# Patient Record
Sex: Female | Born: 1940 | Race: White | Hispanic: No | Smoking: Former smoker
Health system: Southern US, Community
[De-identification: ages and names within clinical notes are randomized; demographics above are authoritative.]

## PROBLEM LIST (undated history)

## (undated) DIAGNOSIS — M1990 Unspecified osteoarthritis, unspecified site: Secondary | ICD-10-CM

## (undated) DIAGNOSIS — C801 Malignant (primary) neoplasm, unspecified: Secondary | ICD-10-CM

## (undated) DIAGNOSIS — K219 Gastro-esophageal reflux disease without esophagitis: Secondary | ICD-10-CM

## (undated) DIAGNOSIS — K449 Diaphragmatic hernia without obstruction or gangrene: Secondary | ICD-10-CM

## (undated) DIAGNOSIS — E7800 Pure hypercholesterolemia, unspecified: Secondary | ICD-10-CM

## (undated) DIAGNOSIS — D334 Benign neoplasm of spinal cord: Secondary | ICD-10-CM

## (undated) DIAGNOSIS — Z803 Family history of malignant neoplasm of breast: Secondary | ICD-10-CM

## (undated) DIAGNOSIS — N951 Menopausal and female climacteric states: Secondary | ICD-10-CM

## (undated) DIAGNOSIS — G4730 Sleep apnea, unspecified: Secondary | ICD-10-CM

## (undated) DIAGNOSIS — T7840XA Allergy, unspecified, initial encounter: Secondary | ICD-10-CM

## (undated) HISTORY — DX: Sleep apnea, unspecified: G47.30

## (undated) HISTORY — DX: Malignant (primary) neoplasm, unspecified: C80.1

## (undated) HISTORY — DX: Menopausal and female climacteric states: N95.1

## (undated) HISTORY — DX: Pure hypercholesterolemia, unspecified: E78.00

## (undated) HISTORY — DX: Benign neoplasm of spinal cord: D33.4

## (undated) HISTORY — DX: Family history of malignant neoplasm of breast: Z80.3

## (undated) HISTORY — DX: Unspecified osteoarthritis, unspecified site: M19.90

## (undated) HISTORY — DX: Allergy, unspecified, initial encounter: T78.40XA

## (undated) HISTORY — DX: Gastro-esophageal reflux disease without esophagitis: K21.9

## (undated) HISTORY — DX: Diaphragmatic hernia without obstruction or gangrene: K44.9

---

## 1972-02-12 HISTORY — PX: CHOLECYSTECTOMY: SHX55

## 1972-02-12 HISTORY — PX: APPENDECTOMY: SHX54

## 1984-07-30 ENCOUNTER — Encounter (INDEPENDENT_AMBULATORY_CARE_PROVIDER_SITE_OTHER): Payer: Self-pay | Admitting: *Deleted

## 1984-11-20 ENCOUNTER — Encounter (INDEPENDENT_AMBULATORY_CARE_PROVIDER_SITE_OTHER): Payer: Self-pay | Admitting: *Deleted

## 1985-02-12 ENCOUNTER — Encounter (INDEPENDENT_AMBULATORY_CARE_PROVIDER_SITE_OTHER): Payer: Self-pay | Admitting: *Deleted

## 1994-08-30 ENCOUNTER — Encounter (INDEPENDENT_AMBULATORY_CARE_PROVIDER_SITE_OTHER): Payer: Self-pay | Admitting: *Deleted

## 1995-10-20 ENCOUNTER — Encounter (INDEPENDENT_AMBULATORY_CARE_PROVIDER_SITE_OTHER): Payer: Self-pay | Admitting: *Deleted

## 1998-08-31 ENCOUNTER — Other Ambulatory Visit: Admission: RE | Admit: 1998-08-31 | Discharge: 1998-08-31 | Payer: Self-pay | Admitting: Obstetrics and Gynecology

## 1998-09-01 ENCOUNTER — Encounter (INDEPENDENT_AMBULATORY_CARE_PROVIDER_SITE_OTHER): Payer: Self-pay | Admitting: *Deleted

## 1998-09-05 ENCOUNTER — Encounter (INDEPENDENT_AMBULATORY_CARE_PROVIDER_SITE_OTHER): Payer: Self-pay | Admitting: *Deleted

## 1998-09-08 ENCOUNTER — Encounter: Payer: Self-pay | Admitting: Gastroenterology

## 1998-09-08 ENCOUNTER — Ambulatory Visit (HOSPITAL_COMMUNITY): Admission: RE | Admit: 1998-09-08 | Discharge: 1998-09-08 | Payer: Self-pay | Admitting: Gastroenterology

## 1999-09-05 ENCOUNTER — Other Ambulatory Visit: Admission: RE | Admit: 1999-09-05 | Discharge: 1999-09-05 | Payer: Self-pay | Admitting: Obstetrics and Gynecology

## 2000-09-05 ENCOUNTER — Other Ambulatory Visit: Admission: RE | Admit: 2000-09-05 | Discharge: 2000-09-05 | Payer: Self-pay | Admitting: Obstetrics and Gynecology

## 2001-09-14 ENCOUNTER — Encounter: Admission: RE | Admit: 2001-09-14 | Discharge: 2001-09-14 | Payer: Self-pay | Admitting: Internal Medicine

## 2001-09-14 ENCOUNTER — Encounter: Payer: Self-pay | Admitting: Internal Medicine

## 2001-09-15 ENCOUNTER — Other Ambulatory Visit: Admission: RE | Admit: 2001-09-15 | Discharge: 2001-09-15 | Payer: Self-pay | Admitting: Obstetrics and Gynecology

## 2002-06-02 ENCOUNTER — Ambulatory Visit (HOSPITAL_BASED_OUTPATIENT_CLINIC_OR_DEPARTMENT_OTHER): Admission: RE | Admit: 2002-06-02 | Discharge: 2002-06-02 | Payer: Self-pay | Admitting: Internal Medicine

## 2002-07-27 ENCOUNTER — Encounter: Payer: Self-pay | Admitting: Pulmonary Disease

## 2003-05-12 ENCOUNTER — Other Ambulatory Visit: Admission: RE | Admit: 2003-05-12 | Discharge: 2003-05-12 | Payer: Self-pay | Admitting: Obstetrics and Gynecology

## 2003-11-07 ENCOUNTER — Ambulatory Visit (HOSPITAL_COMMUNITY): Admission: RE | Admit: 2003-11-07 | Discharge: 2003-11-07 | Payer: Self-pay | Admitting: Family Medicine

## 2004-01-09 ENCOUNTER — Ambulatory Visit: Payer: Self-pay | Admitting: Internal Medicine

## 2004-04-10 ENCOUNTER — Ambulatory Visit: Payer: Self-pay | Admitting: Internal Medicine

## 2004-06-25 ENCOUNTER — Other Ambulatory Visit: Admission: RE | Admit: 2004-06-25 | Discharge: 2004-06-25 | Payer: Self-pay | Admitting: Obstetrics and Gynecology

## 2004-07-05 ENCOUNTER — Ambulatory Visit: Payer: Self-pay | Admitting: Internal Medicine

## 2004-07-10 ENCOUNTER — Ambulatory Visit: Payer: Self-pay | Admitting: Internal Medicine

## 2004-07-16 ENCOUNTER — Ambulatory Visit: Payer: Self-pay | Admitting: Internal Medicine

## 2004-07-26 ENCOUNTER — Ambulatory Visit: Payer: Self-pay | Admitting: Internal Medicine

## 2004-08-29 ENCOUNTER — Ambulatory Visit: Payer: Self-pay | Admitting: Internal Medicine

## 2004-09-04 ENCOUNTER — Ambulatory Visit: Payer: Self-pay | Admitting: Gastroenterology

## 2004-09-04 ENCOUNTER — Ambulatory Visit (HOSPITAL_BASED_OUTPATIENT_CLINIC_OR_DEPARTMENT_OTHER): Admission: RE | Admit: 2004-09-04 | Discharge: 2004-09-04 | Payer: Self-pay | Admitting: Internal Medicine

## 2004-09-05 ENCOUNTER — Ambulatory Visit: Payer: Self-pay | Admitting: Internal Medicine

## 2004-09-05 ENCOUNTER — Encounter: Payer: Self-pay | Admitting: Pulmonary Disease

## 2004-09-13 ENCOUNTER — Ambulatory Visit: Payer: Self-pay | Admitting: Gastroenterology

## 2004-09-13 ENCOUNTER — Encounter: Payer: Self-pay | Admitting: Internal Medicine

## 2004-09-19 ENCOUNTER — Ambulatory Visit: Payer: Self-pay | Admitting: Pulmonary Disease

## 2004-10-09 ENCOUNTER — Ambulatory Visit: Payer: Self-pay | Admitting: Internal Medicine

## 2004-10-29 ENCOUNTER — Ambulatory Visit: Payer: Self-pay | Admitting: Internal Medicine

## 2004-10-30 ENCOUNTER — Ambulatory Visit: Payer: Self-pay | Admitting: Pulmonary Disease

## 2004-11-08 ENCOUNTER — Ambulatory Visit: Payer: Self-pay | Admitting: Internal Medicine

## 2004-11-13 ENCOUNTER — Ambulatory Visit: Payer: Self-pay | Admitting: Internal Medicine

## 2004-11-27 ENCOUNTER — Ambulatory Visit: Payer: Self-pay | Admitting: Pulmonary Disease

## 2005-01-08 ENCOUNTER — Ambulatory Visit: Payer: Self-pay | Admitting: Internal Medicine

## 2005-02-13 ENCOUNTER — Ambulatory Visit: Payer: Self-pay | Admitting: Internal Medicine

## 2005-02-18 ENCOUNTER — Ambulatory Visit: Payer: Self-pay | Admitting: Internal Medicine

## 2005-02-21 ENCOUNTER — Ambulatory Visit: Payer: Self-pay | Admitting: Internal Medicine

## 2005-04-09 ENCOUNTER — Ambulatory Visit: Payer: Self-pay | Admitting: Internal Medicine

## 2005-05-28 ENCOUNTER — Ambulatory Visit: Payer: Self-pay | Admitting: Internal Medicine

## 2005-07-02 ENCOUNTER — Other Ambulatory Visit: Admission: RE | Admit: 2005-07-02 | Discharge: 2005-07-02 | Payer: Self-pay | Admitting: Obstetrics and Gynecology

## 2005-07-25 ENCOUNTER — Ambulatory Visit: Payer: Self-pay | Admitting: Internal Medicine

## 2005-07-31 ENCOUNTER — Ambulatory Visit: Payer: Self-pay | Admitting: Internal Medicine

## 2005-09-02 ENCOUNTER — Encounter: Admission: RE | Admit: 2005-09-02 | Discharge: 2005-09-02 | Payer: Self-pay | Admitting: Obstetrics and Gynecology

## 2005-09-06 ENCOUNTER — Ambulatory Visit (HOSPITAL_COMMUNITY): Admission: RE | Admit: 2005-09-06 | Discharge: 2005-09-07 | Payer: Self-pay | Admitting: Urology

## 2005-10-29 ENCOUNTER — Ambulatory Visit: Payer: Self-pay | Admitting: Internal Medicine

## 2006-01-30 ENCOUNTER — Ambulatory Visit: Payer: Self-pay | Admitting: Internal Medicine

## 2006-01-30 LAB — CONVERTED CEMR LAB
AST: 24 units/L (ref 0–37)
Albumin: 3.8 g/dL (ref 3.5–5.2)
Chloride: 102 meq/L (ref 96–112)
Chol/HDL Ratio, serum: 3
Creatinine, Ser: 0.7 mg/dL (ref 0.4–1.2)
Crystals: NEGATIVE
Glomerular Filtration Rate, Af Am: 108 mL/min/{1.73_m2}
Glucose, Bld: 108 mg/dL — ABNORMAL HIGH (ref 70–99)
HCT: 38.8 % (ref 36.0–46.0)
Hgb A1c MFr Bld: 6.6 % — ABNORMAL HIGH (ref 4.6–6.0)
LDL Cholesterol: 112 mg/dL — ABNORMAL HIGH (ref 0–99)
Microalb Creat Ratio: 23.2 mg/g (ref 0.0–30.0)
Microalb, Ur: 2.8 mg/dL — ABNORMAL HIGH (ref 0.0–1.9)
RDW: 12.7 % (ref 11.5–14.6)
Sodium: 138 meq/L (ref 135–145)
Specific Gravity, Urine: 1.015 (ref 1.000–1.03)
TSH: 1.17 microintl units/mL (ref 0.35–5.50)
Total Bilirubin: 0.8 mg/dL (ref 0.3–1.2)
Urine Glucose: NEGATIVE mg/dL
Urobilinogen, UA: 0.2 (ref 0.0–1.0)
WBC: 5.4 10*3/uL (ref 4.5–10.5)
pH: 8 (ref 5.0–8.0)

## 2006-02-11 HISTORY — PX: INCONTINENCE SURGERY: SHX676

## 2006-02-28 ENCOUNTER — Ambulatory Visit: Payer: Self-pay

## 2006-06-13 ENCOUNTER — Ambulatory Visit: Payer: Self-pay | Admitting: Internal Medicine

## 2006-07-08 ENCOUNTER — Other Ambulatory Visit: Admission: RE | Admit: 2006-07-08 | Discharge: 2006-07-08 | Payer: Self-pay | Admitting: Obstetrics and Gynecology

## 2006-09-03 ENCOUNTER — Ambulatory Visit: Payer: Self-pay | Admitting: Internal Medicine

## 2006-09-03 LAB — CONVERTED CEMR LAB
ALT: 27 units/L (ref 0–35)
AST: 27 units/L (ref 0–37)
Bilirubin, Direct: 0.1 mg/dL (ref 0.0–0.3)
CO2: 30 meq/L (ref 19–32)
Calcium: 9.1 mg/dL (ref 8.4–10.5)
Chloride: 103 meq/L (ref 96–112)
Cholesterol: 162 mg/dL (ref 0–200)
Creatinine, Ser: 0.8 mg/dL (ref 0.4–1.2)
GFR calc non Af Amer: 77 mL/min
Glucose, Bld: 123 mg/dL — ABNORMAL HIGH (ref 70–99)
HDL: 50.5 mg/dL (ref >39.0)
Hgb A1c MFr Bld: 6.8 % — ABNORMAL HIGH (ref 4.6–6.0)
LDL Cholesterol: 88 mg/dL (ref 0–99)
Sodium: 141 meq/L (ref 135–145)
Total Bilirubin: 0.8 mg/dL (ref 0.3–1.2)
Total Protein: 6.8 g/dL (ref 6.0–8.3)

## 2006-09-04 ENCOUNTER — Ambulatory Visit: Payer: Self-pay | Admitting: Pulmonary Disease

## 2006-09-04 ENCOUNTER — Encounter: Admission: RE | Admit: 2006-09-04 | Discharge: 2006-09-04 | Payer: Self-pay | Admitting: Obstetrics and Gynecology

## 2006-09-15 ENCOUNTER — Ambulatory Visit: Payer: Self-pay | Admitting: Internal Medicine

## 2006-09-16 DIAGNOSIS — N3289 Other specified disorders of bladder: Secondary | ICD-10-CM

## 2006-09-16 DIAGNOSIS — M1990 Unspecified osteoarthritis, unspecified site: Secondary | ICD-10-CM | POA: Insufficient documentation

## 2006-09-16 DIAGNOSIS — K219 Gastro-esophageal reflux disease without esophagitis: Secondary | ICD-10-CM

## 2006-09-16 DIAGNOSIS — G4733 Obstructive sleep apnea (adult) (pediatric): Secondary | ICD-10-CM

## 2006-09-16 DIAGNOSIS — N952 Postmenopausal atrophic vaginitis: Secondary | ICD-10-CM

## 2006-09-16 DIAGNOSIS — E785 Hyperlipidemia, unspecified: Secondary | ICD-10-CM

## 2006-09-16 DIAGNOSIS — D509 Iron deficiency anemia, unspecified: Secondary | ICD-10-CM

## 2006-09-16 DIAGNOSIS — E119 Type 2 diabetes mellitus without complications: Secondary | ICD-10-CM

## 2006-12-11 ENCOUNTER — Encounter: Payer: Self-pay | Admitting: Internal Medicine

## 2006-12-11 ENCOUNTER — Ambulatory Visit: Payer: Self-pay | Admitting: Internal Medicine

## 2006-12-11 DIAGNOSIS — R143 Flatulence: Secondary | ICD-10-CM

## 2006-12-11 DIAGNOSIS — R141 Gas pain: Secondary | ICD-10-CM

## 2006-12-11 DIAGNOSIS — R142 Eructation: Secondary | ICD-10-CM

## 2006-12-11 DIAGNOSIS — D1779 Benign lipomatous neoplasm of other sites: Secondary | ICD-10-CM | POA: Insufficient documentation

## 2006-12-19 ENCOUNTER — Telehealth: Payer: Self-pay | Admitting: Internal Medicine

## 2007-01-07 ENCOUNTER — Encounter: Payer: Self-pay | Admitting: Internal Medicine

## 2007-01-13 ENCOUNTER — Encounter: Payer: Self-pay | Admitting: Internal Medicine

## 2007-02-02 ENCOUNTER — Ambulatory Visit: Payer: Self-pay | Admitting: Internal Medicine

## 2007-02-02 DIAGNOSIS — J309 Allergic rhinitis, unspecified: Secondary | ICD-10-CM | POA: Insufficient documentation

## 2007-02-03 LAB — CONVERTED CEMR LAB
AST: 24 units/L (ref 0–37)
Albumin: 3.6 g/dL (ref 3.5–5.2)
Alkaline Phosphatase: 89 units/L (ref 39–117)
BUN: 14 mg/dL (ref 6–23)
Chloride: 102 meq/L (ref 96–112)
Creatinine, Ser: 0.7 mg/dL (ref 0.4–1.2)
GFR calc non Af Amer: 89 mL/min
HDL: 63.6 mg/dL (ref >39.0)
Hgb A1c MFr Bld: 6.6 % — ABNORMAL HIGH (ref 4.6–6.0)
LDL Cholesterol: 97 mg/dL (ref 0–99)
Potassium: 5 meq/L (ref 3.5–5.1)
Sodium: 140 meq/L (ref 135–145)
TSH: 1.25 microintl units/mL (ref 0.35–5.50)
VLDL: 16 mg/dL (ref 0–40)

## 2007-02-12 DIAGNOSIS — C801 Malignant (primary) neoplasm, unspecified: Secondary | ICD-10-CM

## 2007-02-12 HISTORY — DX: Malignant (primary) neoplasm, unspecified: C80.1

## 2007-03-30 ENCOUNTER — Encounter: Payer: Self-pay | Admitting: Internal Medicine

## 2007-05-28 ENCOUNTER — Ambulatory Visit: Payer: Self-pay | Admitting: Internal Medicine

## 2007-05-28 LAB — CONVERTED CEMR LAB
ALT: 16 units/L (ref 0–35)
AST: 23 units/L (ref 0–37)
Alkaline Phosphatase: 114 units/L (ref 39–117)
CO2: 30 meq/L (ref 19–32)
Chloride: 102 meq/L (ref 96–112)
GFR calc non Af Amer: 77 mL/min
Potassium: 4.4 meq/L (ref 3.5–5.1)
Total Bilirubin: 1 mg/dL (ref 0.3–1.2)

## 2007-06-01 ENCOUNTER — Telehealth: Payer: Self-pay | Admitting: Internal Medicine

## 2007-06-05 ENCOUNTER — Ambulatory Visit: Payer: Self-pay | Admitting: Internal Medicine

## 2007-06-07 DIAGNOSIS — M545 Low back pain: Secondary | ICD-10-CM | POA: Insufficient documentation

## 2007-06-16 ENCOUNTER — Encounter: Admission: RE | Admit: 2007-06-16 | Discharge: 2007-06-16 | Payer: Self-pay | Admitting: Orthopedic Surgery

## 2007-06-18 ENCOUNTER — Telehealth: Payer: Self-pay | Admitting: Internal Medicine

## 2007-06-18 ENCOUNTER — Encounter: Payer: Self-pay | Admitting: Internal Medicine

## 2007-07-09 ENCOUNTER — Other Ambulatory Visit: Admission: RE | Admit: 2007-07-09 | Discharge: 2007-07-09 | Payer: Self-pay | Admitting: Gynecology

## 2007-07-22 ENCOUNTER — Encounter: Payer: Self-pay | Admitting: Internal Medicine

## 2007-07-29 ENCOUNTER — Encounter: Payer: Self-pay | Admitting: Internal Medicine

## 2007-08-24 ENCOUNTER — Ambulatory Visit: Payer: Self-pay | Admitting: Internal Medicine

## 2007-08-24 LAB — CONVERTED CEMR LAB
Calcium: 9.2 mg/dL (ref 8.4–10.5)
GFR calc Af Amer: 93 mL/min
GFR calc non Af Amer: 77 mL/min
HDL: 47 mg/dL (ref >39.0)
Hgb A1c MFr Bld: 6.4 % — ABNORMAL HIGH (ref 4.6–6.0)
LDL Cholesterol: 69 mg/dL (ref 0–99)
Sodium: 142 meq/L (ref 135–145)
TSH: 2.36 microintl units/mL (ref 0.35–5.50)
Total CHOL/HDL Ratio: 3
VLDL: 23 mg/dL (ref 0–40)

## 2007-08-27 ENCOUNTER — Ambulatory Visit: Payer: Self-pay | Admitting: Internal Medicine

## 2007-08-27 DIAGNOSIS — S22009A Unspecified fracture of unspecified thoracic vertebra, initial encounter for closed fracture: Secondary | ICD-10-CM | POA: Insufficient documentation

## 2007-09-14 ENCOUNTER — Encounter: Admission: RE | Admit: 2007-09-14 | Discharge: 2007-09-14 | Payer: Self-pay | Admitting: Obstetrics and Gynecology

## 2007-09-17 DIAGNOSIS — M949 Disorder of cartilage, unspecified: Secondary | ICD-10-CM

## 2007-09-17 DIAGNOSIS — M899 Disorder of bone, unspecified: Secondary | ICD-10-CM | POA: Insufficient documentation

## 2007-09-21 ENCOUNTER — Ambulatory Visit: Payer: Self-pay | Admitting: Internal Medicine

## 2007-09-21 DIAGNOSIS — K5900 Constipation, unspecified: Secondary | ICD-10-CM | POA: Insufficient documentation

## 2007-09-21 DIAGNOSIS — R1319 Other dysphagia: Secondary | ICD-10-CM | POA: Insufficient documentation

## 2007-09-23 ENCOUNTER — Encounter (INDEPENDENT_AMBULATORY_CARE_PROVIDER_SITE_OTHER): Payer: Self-pay | Admitting: Diagnostic Radiology

## 2007-09-23 ENCOUNTER — Encounter: Admission: RE | Admit: 2007-09-23 | Discharge: 2007-09-23 | Payer: Self-pay | Admitting: Obstetrics and Gynecology

## 2007-10-01 ENCOUNTER — Ambulatory Visit: Payer: Self-pay | Admitting: Internal Medicine

## 2007-10-02 ENCOUNTER — Encounter: Admission: RE | Admit: 2007-10-02 | Discharge: 2007-10-02 | Payer: Self-pay | Admitting: Obstetrics and Gynecology

## 2007-10-06 ENCOUNTER — Ambulatory Visit: Payer: Self-pay | Admitting: Cardiovascular Disease

## 2007-10-13 HISTORY — PX: BREAST LUMPECTOMY: SHX2

## 2007-10-16 ENCOUNTER — Ambulatory Visit: Payer: Self-pay

## 2007-10-16 ENCOUNTER — Encounter: Payer: Self-pay | Admitting: Cardiovascular Disease

## 2007-10-26 ENCOUNTER — Encounter: Admission: RE | Admit: 2007-10-26 | Discharge: 2007-10-26 | Payer: Self-pay | Admitting: Surgery

## 2007-10-27 ENCOUNTER — Ambulatory Visit (HOSPITAL_BASED_OUTPATIENT_CLINIC_OR_DEPARTMENT_OTHER): Admission: RE | Admit: 2007-10-27 | Discharge: 2007-10-27 | Payer: Self-pay | Admitting: Surgery

## 2007-10-27 ENCOUNTER — Encounter (INDEPENDENT_AMBULATORY_CARE_PROVIDER_SITE_OTHER): Payer: Self-pay | Admitting: Surgery

## 2007-10-27 ENCOUNTER — Encounter: Admission: RE | Admit: 2007-10-27 | Discharge: 2007-10-27 | Payer: Self-pay | Admitting: Surgery

## 2007-11-02 ENCOUNTER — Ambulatory Visit: Payer: Self-pay | Admitting: Obstetrics and Gynecology

## 2007-11-04 ENCOUNTER — Ambulatory Visit: Payer: Self-pay | Admitting: Oncology

## 2007-11-07 ENCOUNTER — Emergency Department (HOSPITAL_COMMUNITY): Admission: EM | Admit: 2007-11-07 | Discharge: 2007-11-07 | Payer: Self-pay | Admitting: Emergency Medicine

## 2007-11-25 ENCOUNTER — Ambulatory Visit: Admission: RE | Admit: 2007-11-25 | Discharge: 2008-02-10 | Payer: Self-pay | Admitting: Radiation Oncology

## 2007-12-01 ENCOUNTER — Ambulatory Visit: Payer: Self-pay | Admitting: Internal Medicine

## 2007-12-01 LAB — CBC WITH DIFFERENTIAL/PLATELET
BASO%: 0.6 % (ref 0.0–2.0)
Basophils Absolute: 0 10*3/uL (ref 0.0–0.1)
EOS%: 1.4 % (ref 0.0–7.0)
Eosinophils Absolute: 0.1 10*3/uL (ref 0.0–0.5)
HCT: 34.8 % (ref 34.8–46.6)
HGB: 12 g/dL (ref 11.6–15.9)
LYMPH%: 32.2 % (ref 14.0–48.0)
MCH: 31.5 pg (ref 26.0–34.0)
MCHC: 34.5 g/dL (ref 32.0–36.0)
MCV: 91.1 fL (ref 81.0–101.0)
MONO#: 0.5 10*3/uL (ref 0.1–0.9)
MONO%: 6.2 % (ref 0.0–13.0)
NEUT#: 4.7 10*3/uL (ref 1.5–6.5)
NEUT%: 59.6 % (ref 39.6–76.8)
Platelets: 254 10*3/uL (ref 145–400)
RBC: 3.82 10*6/uL (ref 3.70–5.32)
RDW: 13.3 % (ref 11.3–14.5)
WBC: 7.9 10*3/uL (ref 3.9–10.0)
lymph#: 2.5 10*3/uL (ref 0.9–3.3)

## 2007-12-01 LAB — COMPREHENSIVE METABOLIC PANEL WITH GFR
ALT: 14 U/L (ref 0–35)
AST: 17 U/L (ref 0–37)
Albumin: 4.4 g/dL (ref 3.5–5.2)
Alkaline Phosphatase: 87 U/L (ref 39–117)
BUN: 23 mg/dL (ref 6–23)
CO2: 26 meq/L (ref 19–32)
Calcium: 9.2 mg/dL (ref 8.4–10.5)
Chloride: 103 meq/L (ref 96–112)
Creatinine, Ser: 0.76 mg/dL (ref 0.40–1.20)
Glucose, Bld: 93 mg/dL (ref 70–99)
Potassium: 3.9 meq/L (ref 3.5–5.3)
Sodium: 138 meq/L (ref 135–145)
Total Bilirubin: 0.6 mg/dL (ref 0.3–1.2)
Total Protein: 6.6 g/dL (ref 6.0–8.3)

## 2007-12-03 ENCOUNTER — Ambulatory Visit: Payer: Self-pay | Admitting: Internal Medicine

## 2007-12-03 LAB — CONVERTED CEMR LAB
AST: 21 units/L (ref 0–37)
Albumin: 3.6 g/dL (ref 3.5–5.2)
BUN: 21 mg/dL (ref 6–23)
Calcium, Total (PTH): 8.9 mg/dL (ref 8.4–10.5)
Calcium: 8.9 mg/dL (ref 8.4–10.5)
Chloride: 103 meq/L (ref 96–112)
Creatinine, Ser: 0.7 mg/dL (ref 0.4–1.2)
GFR calc Af Amer: 108 mL/min
GFR calc non Af Amer: 89 mL/min
Hgb A1c MFr Bld: 6.1 % — ABNORMAL HIGH (ref 4.6–6.0)

## 2007-12-04 ENCOUNTER — Ambulatory Visit: Payer: Self-pay | Admitting: Women's Health

## 2007-12-07 ENCOUNTER — Ambulatory Visit: Payer: Self-pay | Admitting: Internal Medicine

## 2007-12-07 DIAGNOSIS — Z853 Personal history of malignant neoplasm of breast: Secondary | ICD-10-CM

## 2007-12-23 LAB — CBC WITH DIFFERENTIAL/PLATELET
BASO%: 0.5 % (ref 0.0–2.0)
Eosinophils Absolute: 0.1 10*3/uL (ref 0.0–0.5)
LYMPH%: 30.2 % (ref 14.0–48.0)
MCH: 31.1 pg (ref 26.0–34.0)
MCHC: 34.1 g/dL (ref 32.0–36.0)
MCV: 91 fL (ref 81.0–101.0)
MONO%: 7.2 % (ref 0.0–13.0)
Platelets: 255 10*3/uL (ref 145–400)
RBC: 3.81 10*6/uL (ref 3.70–5.32)

## 2008-02-01 ENCOUNTER — Ambulatory Visit: Payer: Self-pay | Admitting: Genetic Counselor

## 2008-03-09 ENCOUNTER — Ambulatory Visit: Payer: Self-pay | Admitting: Genetic Counselor

## 2008-03-09 ENCOUNTER — Ambulatory Visit: Payer: Self-pay | Admitting: Internal Medicine

## 2008-03-09 LAB — CONVERTED CEMR LAB
Alkaline Phosphatase: 76 units/L (ref 39–117)
Bilirubin, Direct: 0.1 mg/dL (ref 0.0–0.3)
GFR calc Af Amer: 92 mL/min
GFR calc non Af Amer: 76 mL/min
Glucose, Bld: 124 mg/dL — ABNORMAL HIGH (ref 70–99)
LDL Cholesterol: 99 mg/dL (ref 0–99)
Potassium: 4.6 meq/L (ref 3.5–5.1)
Sodium: 141 meq/L (ref 135–145)
VLDL: 10 mg/dL (ref 0–40)

## 2008-03-10 ENCOUNTER — Encounter: Payer: Self-pay | Admitting: Internal Medicine

## 2008-03-11 ENCOUNTER — Ambulatory Visit: Payer: Self-pay | Admitting: Oncology

## 2008-03-11 LAB — COMPREHENSIVE METABOLIC PANEL
ALT: 21 U/L (ref 0–35)
Albumin: 4.2 g/dL (ref 3.5–5.2)
CO2: 25 mEq/L (ref 19–32)
Glucose, Bld: 102 mg/dL — ABNORMAL HIGH (ref 70–99)
Potassium: 4 mEq/L (ref 3.5–5.3)
Sodium: 138 mEq/L (ref 135–145)
Total Bilirubin: 0.6 mg/dL (ref 0.3–1.2)
Total Protein: 6.5 g/dL (ref 6.0–8.3)

## 2008-03-11 LAB — CBC WITH DIFFERENTIAL/PLATELET
BASO%: 0.3 % (ref 0.0–2.0)
Eosinophils Absolute: 0.1 10*3/uL (ref 0.0–0.5)
LYMPH%: 22.8 % (ref 14.0–48.0)
MCHC: 34.4 g/dL (ref 32.0–36.0)
MONO#: 0.4 10*3/uL (ref 0.1–0.9)
NEUT#: 3.7 10*3/uL (ref 1.5–6.5)
RBC: 4.04 10*6/uL (ref 3.70–5.32)
RDW: 14.1 % (ref 11.3–14.5)
WBC: 5.5 10*3/uL (ref 3.9–10.0)
lymph#: 1.2 10*3/uL (ref 0.9–3.3)

## 2008-03-14 ENCOUNTER — Ambulatory Visit: Payer: Self-pay | Admitting: Internal Medicine

## 2008-03-14 DIAGNOSIS — R002 Palpitations: Secondary | ICD-10-CM

## 2008-03-23 ENCOUNTER — Ambulatory Visit: Payer: Self-pay

## 2008-03-23 ENCOUNTER — Ambulatory Visit: Payer: Self-pay | Admitting: Cardiovascular Disease

## 2008-03-23 DIAGNOSIS — R011 Cardiac murmur, unspecified: Secondary | ICD-10-CM

## 2008-04-15 ENCOUNTER — Ambulatory Visit: Payer: Self-pay | Admitting: Pulmonary Disease

## 2008-04-18 ENCOUNTER — Ambulatory Visit: Payer: Self-pay | Admitting: Oncology

## 2008-05-19 ENCOUNTER — Encounter: Payer: Self-pay | Admitting: Cardiovascular Disease

## 2008-05-19 ENCOUNTER — Ambulatory Visit: Payer: Self-pay | Admitting: Cardiovascular Disease

## 2008-05-30 ENCOUNTER — Ambulatory Visit: Payer: Self-pay | Admitting: Genetic Counselor

## 2008-06-01 ENCOUNTER — Encounter: Payer: Self-pay | Admitting: Internal Medicine

## 2008-06-04 ENCOUNTER — Encounter: Payer: Self-pay | Admitting: Pulmonary Disease

## 2008-06-16 ENCOUNTER — Encounter: Payer: Self-pay | Admitting: Internal Medicine

## 2008-06-16 ENCOUNTER — Encounter: Payer: Self-pay | Admitting: Cardiovascular Disease

## 2008-06-23 ENCOUNTER — Ambulatory Visit: Payer: Self-pay | Admitting: Internal Medicine

## 2008-06-23 DIAGNOSIS — E559 Vitamin D deficiency, unspecified: Secondary | ICD-10-CM | POA: Insufficient documentation

## 2008-06-23 LAB — CONVERTED CEMR LAB
ALT: 20 units/L (ref 0–35)
AST: 22 units/L (ref 0–37)
Albumin: 3.6 g/dL (ref 3.5–5.2)
BUN: 15 mg/dL (ref 6–23)
Chloride: 103 meq/L (ref 96–112)
Cholesterol: 167 mg/dL (ref 0–200)
Glucose, Bld: 120 mg/dL — ABNORMAL HIGH (ref 70–99)
Potassium: 4.8 meq/L (ref 3.5–5.1)
Vit D, 25-Hydroxy: 46 ng/mL (ref 30–89)

## 2008-06-27 ENCOUNTER — Ambulatory Visit: Payer: Self-pay | Admitting: Internal Medicine

## 2008-07-07 ENCOUNTER — Ambulatory Visit: Payer: Self-pay | Admitting: Genetic Counselor

## 2008-07-12 ENCOUNTER — Ambulatory Visit: Payer: Self-pay | Admitting: Oncology

## 2008-08-03 ENCOUNTER — Encounter: Payer: Self-pay | Admitting: Internal Medicine

## 2008-09-14 ENCOUNTER — Encounter: Admission: RE | Admit: 2008-09-14 | Discharge: 2008-09-14 | Payer: Self-pay | Admitting: Obstetrics and Gynecology

## 2008-09-17 IMAGING — CR DG CHEST 2V
2 series · 2 of 2 positions shown · non-contrast
Comparison: None

CLINICAL DATA: Preop for right lumpectomy

CHEST - 2 VIEW

[w chest pa]
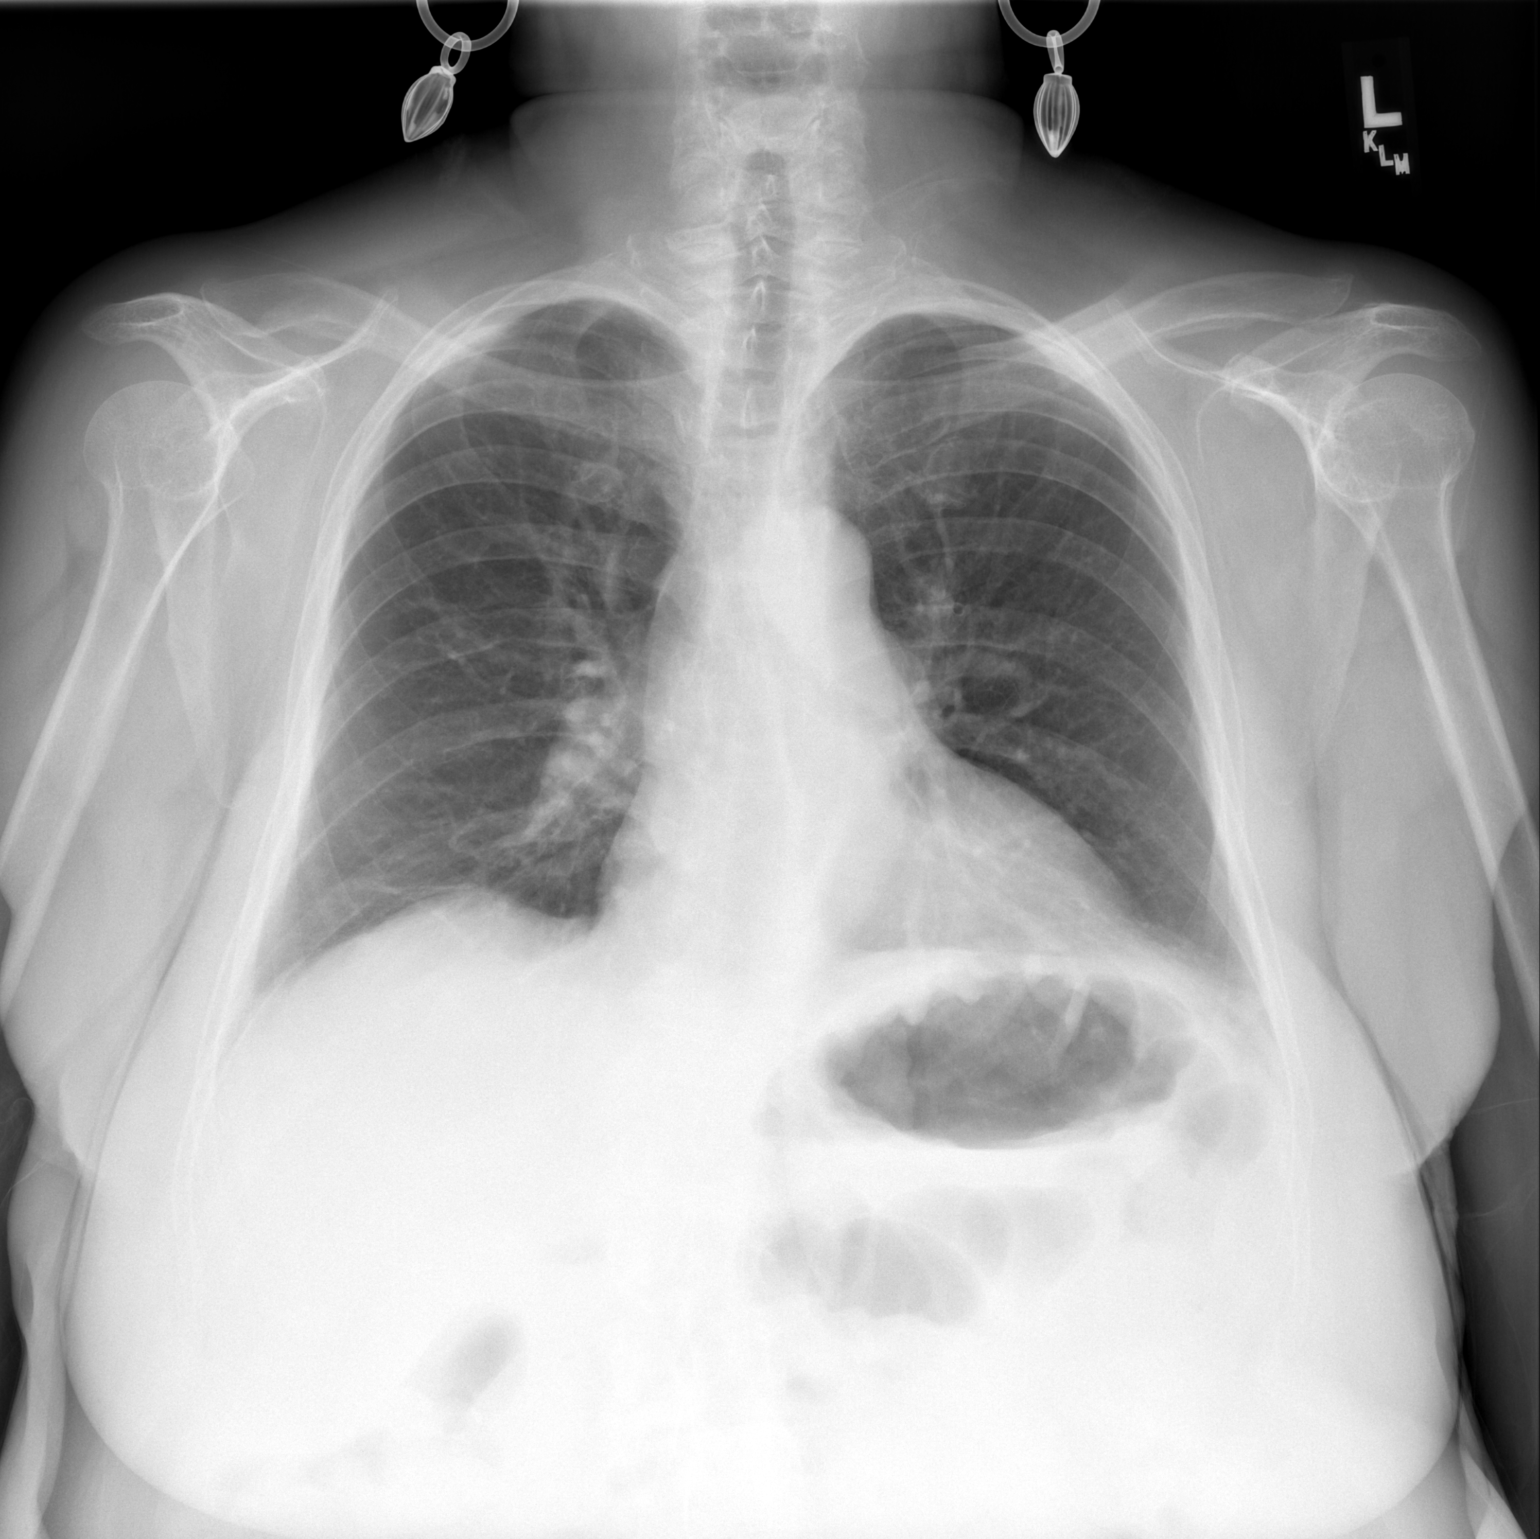

[w chest lat]
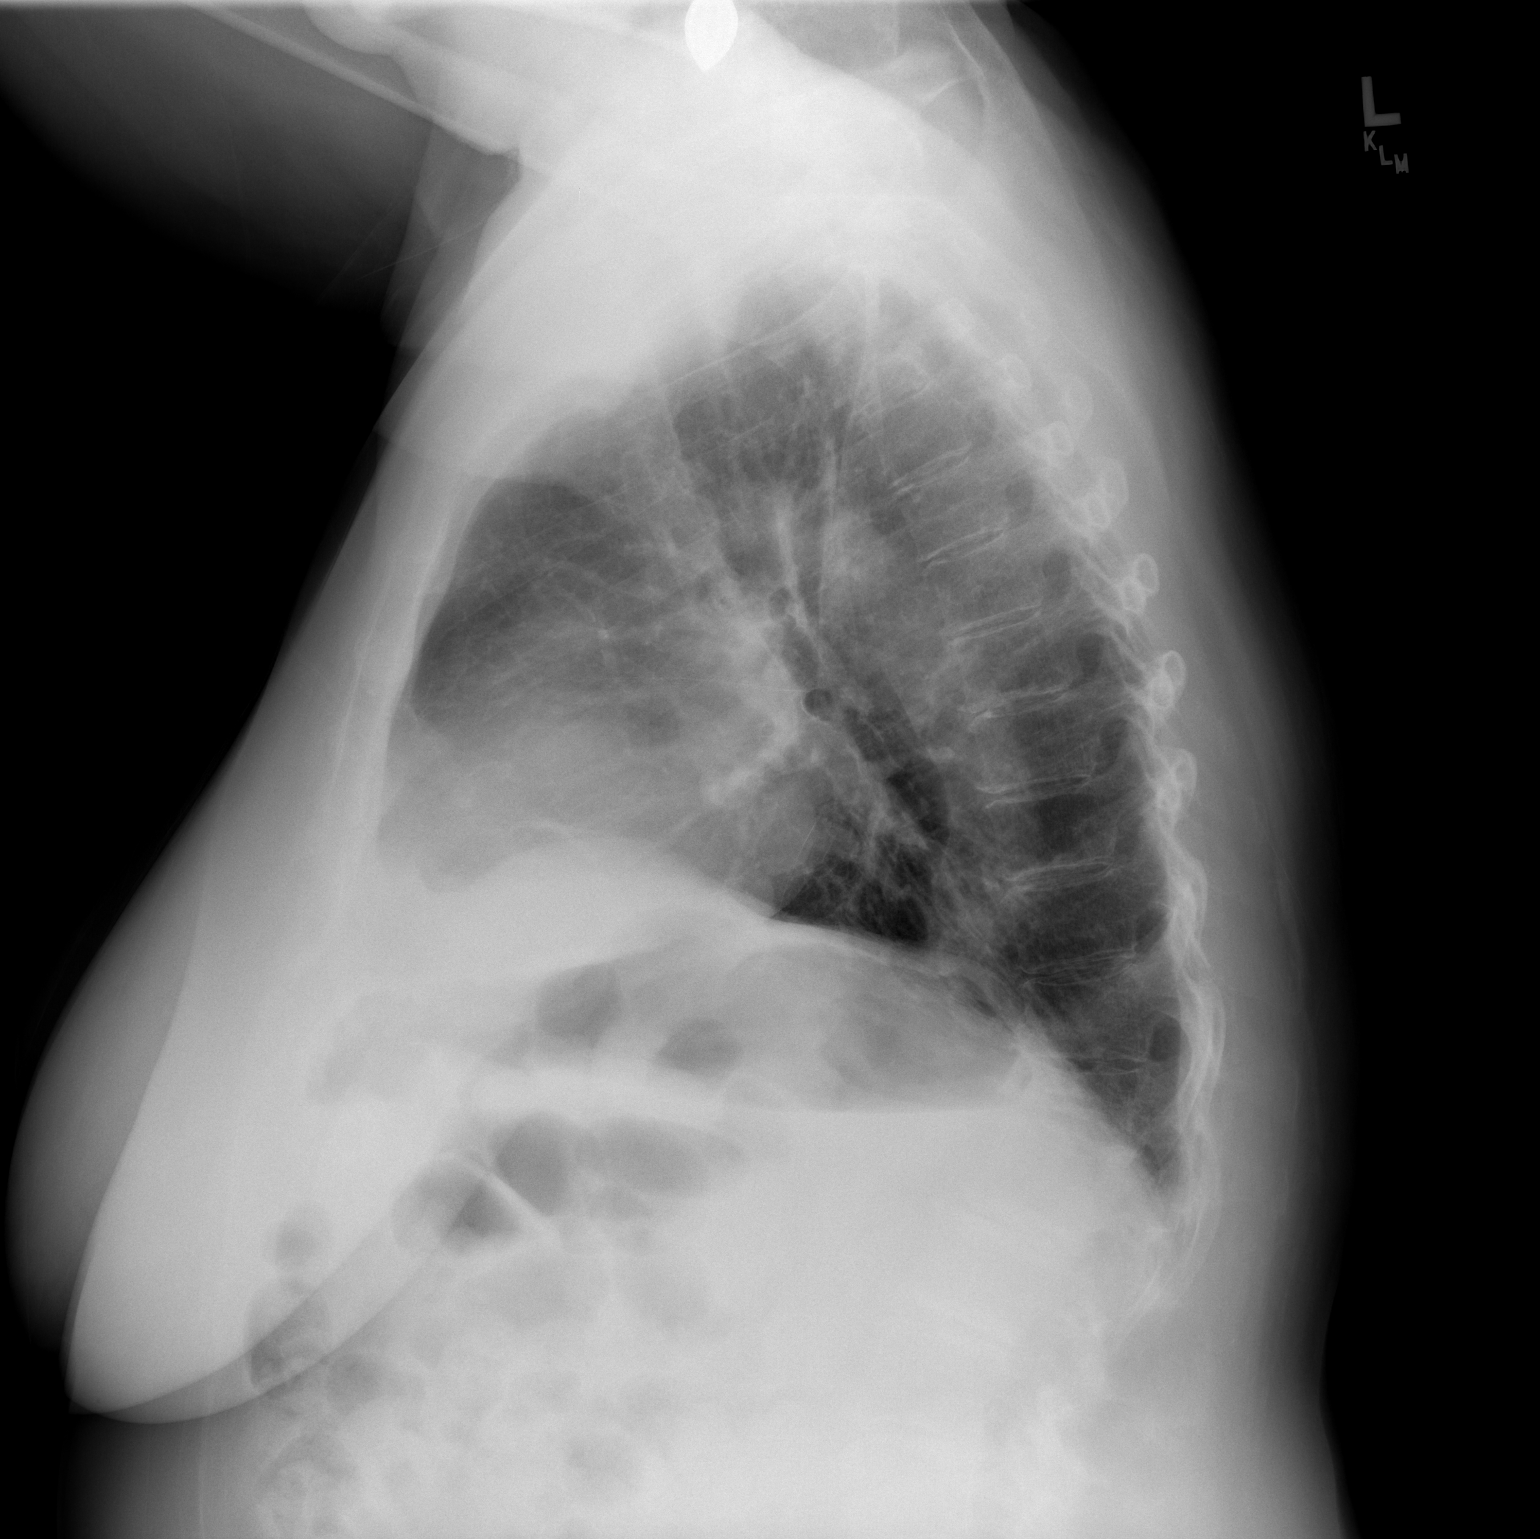

[2 of 2 positions shown; findings below may reference images not displayed]

FINDINGS: Two views of the chest show no active infiltrate or
effusion.  Minimal basilar linear atelectasis or scarring is noted.
The heart is within normal limits in size.  There is a partially
compressed lower thoracic vertebral body which is most likely old
with some spurring present.
IMPRESSION: No active lung disease.  Probable old compression deformity of a
lower thoracic vertebra.

## 2008-09-28 ENCOUNTER — Encounter: Payer: Self-pay | Admitting: Internal Medicine

## 2008-10-10 ENCOUNTER — Other Ambulatory Visit: Admission: RE | Admit: 2008-10-10 | Discharge: 2008-10-10 | Payer: Self-pay | Admitting: Obstetrics and Gynecology

## 2008-10-10 ENCOUNTER — Encounter: Payer: Self-pay | Admitting: Obstetrics and Gynecology

## 2008-10-10 ENCOUNTER — Ambulatory Visit: Payer: Self-pay | Admitting: Obstetrics and Gynecology

## 2008-10-20 ENCOUNTER — Encounter (INDEPENDENT_AMBULATORY_CARE_PROVIDER_SITE_OTHER): Payer: Self-pay | Admitting: *Deleted

## 2008-10-28 ENCOUNTER — Ambulatory Visit: Payer: Self-pay | Admitting: Internal Medicine

## 2008-10-28 LAB — CONVERTED CEMR LAB
CO2: 29 meq/L (ref 19–32)
Calcium: 9.2 mg/dL (ref 8.4–10.5)
Chloride: 107 meq/L (ref 96–112)
HDL: 60.1 mg/dL (ref >39.00)
LDL Cholesterol: 76 mg/dL (ref 0–99)
Sodium: 142 meq/L (ref 135–145)
Total CHOL/HDL Ratio: 3
Triglycerides: 110 mg/dL (ref 0.0–149.0)

## 2008-11-01 ENCOUNTER — Ambulatory Visit: Payer: Self-pay | Admitting: Internal Medicine

## 2008-11-14 ENCOUNTER — Encounter: Payer: Self-pay | Admitting: Internal Medicine

## 2008-11-23 ENCOUNTER — Telehealth: Payer: Self-pay | Admitting: Internal Medicine

## 2008-12-27 ENCOUNTER — Encounter: Payer: Self-pay | Admitting: Internal Medicine

## 2009-01-13 ENCOUNTER — Ambulatory Visit: Payer: Self-pay | Admitting: Oncology

## 2009-01-17 ENCOUNTER — Encounter: Payer: Self-pay | Admitting: Internal Medicine

## 2009-01-17 LAB — COMPREHENSIVE METABOLIC PANEL
Albumin: 3.7 g/dL (ref 3.5–5.2)
Alkaline Phosphatase: 85 U/L (ref 39–117)
BUN: 15 mg/dL (ref 6–23)
CO2: 29 mEq/L (ref 19–32)
Calcium: 8.8 mg/dL (ref 8.4–10.5)
Chloride: 103 mEq/L (ref 96–112)
Glucose, Bld: 81 mg/dL (ref 70–99)
Potassium: 4.1 mEq/L (ref 3.5–5.3)
Sodium: 138 mEq/L (ref 135–145)
Total Protein: 6.6 g/dL (ref 6.0–8.3)

## 2009-01-17 LAB — CBC WITH DIFFERENTIAL/PLATELET
Basophils Absolute: 0 10*3/uL (ref 0.0–0.1)
Eosinophils Absolute: 0.1 10*3/uL (ref 0.0–0.5)
HGB: 12.1 g/dL (ref 11.6–15.9)
MCV: 92.6 fL (ref 79.5–101.0)
MONO#: 0.4 10*3/uL (ref 0.1–0.9)
MONO%: 7.2 % (ref 0.0–14.0)
NEUT#: 3.4 10*3/uL (ref 1.5–6.5)
RDW: 13.6 % (ref 11.2–14.5)
WBC: 5.3 10*3/uL (ref 3.9–10.3)

## 2009-01-18 LAB — VITAMIN D 25 HYDROXY (VIT D DEFICIENCY, FRACTURES): Vit D, 25-Hydroxy: 20 ng/mL — ABNORMAL LOW (ref 30–89)

## 2009-01-19 ENCOUNTER — Ambulatory Visit: Payer: Self-pay | Admitting: Internal Medicine

## 2009-01-19 LAB — CONVERTED CEMR LAB
AST: 25 units/L (ref 0–37)
Albumin: 3.7 g/dL (ref 3.5–5.2)
BUN: 12 mg/dL (ref 6–23)
Basophils Absolute: 0 10*3/uL (ref 0.0–0.1)
CO2: 31 meq/L (ref 19–32)
Eosinophils Relative: 3.5 % (ref 0.0–5.0)
GFR calc non Af Amer: 76.05 mL/min (ref >60)
Glucose, Bld: 110 mg/dL — ABNORMAL HIGH (ref 70–99)
HCT: 38.3 % (ref 36.0–46.0)
Hgb A1c MFr Bld: 6.4 % (ref 4.6–6.5)
Iron: 87 ug/dL (ref 42–145)
Lymphocytes Relative: 26.9 % (ref 12.0–46.0)
Monocytes Relative: 8.2 % (ref 3.0–12.0)
Neutrophils Relative %: 60.5 % (ref 43.0–77.0)
Platelets: 217 10*3/uL (ref 150.0–400.0)
Potassium: 4.4 meq/L (ref 3.5–5.1)
Saturation Ratios: 20.6 % (ref 20.0–50.0)
Total Protein: 6.7 g/dL (ref 6.0–8.3)
Transferrin: 301.5 mg/dL (ref 212.0–360.0)
WBC: 5 10*3/uL (ref 4.5–10.5)

## 2009-01-23 ENCOUNTER — Ambulatory Visit: Payer: Self-pay | Admitting: Internal Medicine

## 2009-01-24 ENCOUNTER — Encounter: Payer: Self-pay | Admitting: Internal Medicine

## 2009-03-13 ENCOUNTER — Ambulatory Visit: Payer: Self-pay | Admitting: Cardiovascular Disease

## 2009-03-13 DIAGNOSIS — R0602 Shortness of breath: Secondary | ICD-10-CM | POA: Insufficient documentation

## 2009-05-25 ENCOUNTER — Ambulatory Visit: Payer: Self-pay | Admitting: Internal Medicine

## 2009-05-25 LAB — CONVERTED CEMR LAB
ALT: 20 units/L (ref 0–35)
AST: 23 units/L (ref 0–37)
Albumin: 3.7 g/dL (ref 3.5–5.2)
Alkaline Phosphatase: 86 units/L (ref 39–117)
Basophils Absolute: 0 10*3/uL (ref 0.0–0.1)
Calcium: 9.4 mg/dL (ref 8.4–10.5)
Creatinine, Ser: 0.8 mg/dL (ref 0.4–1.2)
Eosinophils Relative: 4.1 % (ref 0.0–5.0)
GFR calc non Af Amer: 75.97 mL/min (ref >60)
HCT: 36.2 % (ref 36.0–46.0)
HDL: 71.4 mg/dL (ref >39.00)
Hgb A1c MFr Bld: 6.3 % (ref 4.6–6.5)
Lymphs Abs: 1.4 10*3/uL (ref 0.7–4.0)
MCV: 90.7 fL (ref 78.0–100.0)
Monocytes Absolute: 0.3 10*3/uL (ref 0.1–1.0)
Monocytes Relative: 6.8 % (ref 3.0–12.0)
Neutrophils Relative %: 60.1 % (ref 43.0–77.0)
Platelets: 243 10*3/uL (ref 150.0–400.0)
RDW: 13.6 % (ref 11.5–14.6)
Sodium: 139 meq/L (ref 135–145)
Triglycerides: 91 mg/dL (ref 0.0–149.0)
WBC: 4.8 10*3/uL (ref 4.5–10.5)

## 2009-05-29 ENCOUNTER — Telehealth (INDEPENDENT_AMBULATORY_CARE_PROVIDER_SITE_OTHER): Payer: Self-pay | Admitting: *Deleted

## 2009-05-30 ENCOUNTER — Encounter (HOSPITAL_COMMUNITY): Admission: RE | Admit: 2009-05-30 | Discharge: 2009-08-11 | Payer: Self-pay | Admitting: Cardiovascular Disease

## 2009-05-30 ENCOUNTER — Ambulatory Visit: Payer: Self-pay

## 2009-05-30 ENCOUNTER — Ambulatory Visit: Payer: Self-pay | Admitting: Cardiovascular Disease

## 2009-05-31 ENCOUNTER — Ambulatory Visit: Payer: Self-pay | Admitting: Internal Medicine

## 2009-06-19 ENCOUNTER — Telehealth: Payer: Self-pay | Admitting: Internal Medicine

## 2009-07-13 ENCOUNTER — Encounter: Payer: Self-pay | Admitting: Pulmonary Disease

## 2009-07-18 ENCOUNTER — Ambulatory Visit: Payer: Self-pay | Admitting: Oncology

## 2009-07-20 LAB — CBC WITH DIFFERENTIAL/PLATELET
BASO%: 0.4 % (ref 0.0–2.0)
EOS%: 2.5 % (ref 0.0–7.0)
HCT: 37.5 % (ref 34.8–46.6)
LYMPH%: 24.9 % (ref 14.0–49.7)
MCH: 30.7 pg (ref 25.1–34.0)
MCHC: 33.8 g/dL (ref 31.5–36.0)
MCV: 90.9 fL (ref 79.5–101.0)
MONO%: 6.2 % (ref 0.0–14.0)
NEUT%: 66 % (ref 38.4–76.8)
Platelets: 233 10*3/uL (ref 145–400)
RBC: 4.13 10*6/uL (ref 3.70–5.45)
WBC: 6.5 10*3/uL (ref 3.9–10.3)

## 2009-07-20 LAB — COMPREHENSIVE METABOLIC PANEL
ALT: 19 U/L (ref 0–35)
Alkaline Phosphatase: 87 U/L (ref 39–117)
Creatinine, Ser: 0.78 mg/dL (ref 0.40–1.20)
Sodium: 138 mEq/L (ref 135–145)
Total Bilirubin: 0.6 mg/dL (ref 0.3–1.2)
Total Protein: 6.8 g/dL (ref 6.0–8.3)

## 2009-07-20 LAB — VITAMIN D 25 HYDROXY (VIT D DEFICIENCY, FRACTURES): Vit D, 25-Hydroxy: 54 ng/mL (ref 30–89)

## 2009-07-27 ENCOUNTER — Encounter: Payer: Self-pay | Admitting: Internal Medicine

## 2009-09-15 ENCOUNTER — Encounter: Payer: Self-pay | Admitting: Internal Medicine

## 2009-09-28 ENCOUNTER — Encounter: Admission: RE | Admit: 2009-09-28 | Discharge: 2009-09-28 | Payer: Self-pay | Admitting: Oncology

## 2009-09-29 ENCOUNTER — Ambulatory Visit: Payer: Self-pay | Admitting: Internal Medicine

## 2009-09-29 LAB — CONVERTED CEMR LAB
CO2: 30 meq/L (ref 19–32)
Calcium: 9.1 mg/dL (ref 8.4–10.5)
Cholesterol: 158 mg/dL (ref 0–200)
Creatinine, Ser: 0.8 mg/dL (ref 0.4–1.2)
HDL: 52.1 mg/dL (ref >39.00)
Sodium: 141 meq/L (ref 135–145)
Triglycerides: 123 mg/dL (ref 0.0–149.0)

## 2009-10-11 ENCOUNTER — Ambulatory Visit: Payer: Self-pay | Admitting: Obstetrics and Gynecology

## 2009-10-11 ENCOUNTER — Telehealth: Payer: Self-pay | Admitting: Internal Medicine

## 2009-10-13 ENCOUNTER — Ambulatory Visit: Payer: Self-pay | Admitting: Internal Medicine

## 2009-10-13 DIAGNOSIS — N63 Unspecified lump in unspecified breast: Secondary | ICD-10-CM

## 2009-10-13 DIAGNOSIS — R1310 Dysphagia, unspecified: Secondary | ICD-10-CM | POA: Insufficient documentation

## 2009-11-07 ENCOUNTER — Ambulatory Visit: Payer: Self-pay | Admitting: Internal Medicine

## 2009-11-07 DIAGNOSIS — R079 Chest pain, unspecified: Secondary | ICD-10-CM

## 2009-11-07 DIAGNOSIS — R1013 Epigastric pain: Secondary | ICD-10-CM | POA: Insufficient documentation

## 2009-11-16 ENCOUNTER — Encounter: Payer: Self-pay | Admitting: Internal Medicine

## 2009-12-06 ENCOUNTER — Ambulatory Visit: Payer: Self-pay | Admitting: Obstetrics and Gynecology

## 2009-12-13 ENCOUNTER — Ambulatory Visit: Payer: Self-pay | Admitting: Internal Medicine

## 2010-01-10 ENCOUNTER — Ambulatory Visit: Payer: Self-pay | Admitting: Internal Medicine

## 2010-01-17 ENCOUNTER — Ambulatory Visit: Payer: Self-pay | Admitting: Internal Medicine

## 2010-01-18 LAB — CONVERTED CEMR LAB
ALT: 21 units/L (ref 0–35)
AST: 23 units/L (ref 0–37)
Albumin: 3.7 g/dL (ref 3.5–5.2)
Alkaline Phosphatase: 81 units/L (ref 39–117)
BUN: 19 mg/dL (ref 6–23)
CO2: 31 meq/L (ref 19–32)
Chloride: 104 meq/L (ref 96–112)
Cholesterol: 157 mg/dL (ref 0–200)
Glucose, Bld: 113 mg/dL — ABNORMAL HIGH (ref 70–99)
LDL Cholesterol: 80 mg/dL (ref 0–99)
Potassium: 4.3 meq/L (ref 3.5–5.1)
Sodium: 141 meq/L (ref 135–145)
Total CHOL/HDL Ratio: 3
Total Protein: 6.5 g/dL (ref 6.0–8.3)

## 2010-01-22 ENCOUNTER — Ambulatory Visit: Payer: Self-pay | Admitting: Oncology

## 2010-01-24 ENCOUNTER — Encounter: Payer: Self-pay | Admitting: Internal Medicine

## 2010-01-24 LAB — CBC WITH DIFFERENTIAL/PLATELET
BASO%: 0.4 % (ref 0.0–2.0)
Basophils Absolute: 0 10*3/uL (ref 0.0–0.1)
HCT: 36.8 % (ref 34.8–46.6)
HGB: 12.5 g/dL (ref 11.6–15.9)
LYMPH%: 29.4 % (ref 14.0–49.7)
MCH: 30.9 pg (ref 25.1–34.0)
MCHC: 33.8 g/dL (ref 31.5–36.0)
MONO#: 0.5 10*3/uL (ref 0.1–0.9)
NEUT%: 60.1 % (ref 38.4–76.8)
Platelets: 241 10*3/uL (ref 145–400)
lymph#: 1.7 10*3/uL (ref 0.9–3.3)

## 2010-01-24 LAB — COMPREHENSIVE METABOLIC PANEL
BUN: 16 mg/dL (ref 6–23)
CO2: 31 mEq/L (ref 19–32)
Calcium: 8.9 mg/dL (ref 8.4–10.5)
Chloride: 102 mEq/L (ref 96–112)
Creatinine, Ser: 0.8 mg/dL (ref 0.40–1.20)
Total Bilirubin: 0.7 mg/dL (ref 0.3–1.2)

## 2010-01-25 LAB — VITAMIN D 25 HYDROXY (VIT D DEFICIENCY, FRACTURES): Vit D, 25-Hydroxy: 53 ng/mL (ref 30–89)

## 2010-02-09 ENCOUNTER — Encounter: Payer: Self-pay | Admitting: Internal Medicine

## 2010-02-21 ENCOUNTER — Encounter: Payer: Self-pay | Admitting: Cardiovascular Disease

## 2010-02-21 ENCOUNTER — Ambulatory Visit
Admission: RE | Admit: 2010-02-21 | Discharge: 2010-02-21 | Payer: Self-pay | Source: Home / Self Care | Attending: Cardiovascular Disease | Admitting: Cardiovascular Disease

## 2010-03-04 ENCOUNTER — Encounter: Payer: Self-pay | Admitting: Orthopedic Surgery

## 2010-03-13 NOTE — Assessment & Plan Note (Signed)
Summary: 3 MO ROV /NWS $50   Vital Signs:  Patient Profile:   70 Years Old Female Height:     62 inches Weight:      183 pounds Temp:     97.7 degrees F oral Pulse rate:   52 / minute BP sitting:   126 / 84  (left arm)  Vitals Entered By: Tora Perches (December 07, 2007 3:45 PM)                 PCP:  Jacinta Shoe, MD  Chief Complaint:  Multiple medical problems or concerns.  History of Present Illness: The patient presents for a follow up of hypertension, diabetes, hyperlipidemia   Will get Herceptinin a breast ca  trial Ready for XRT x 6 wks    Current Allergies (reviewed today): ! MORPHINE SULFATE CR (MORPHINE SULFATE) ! SULFA  Past Medical History:    Diabetes mellitus, type II    GERD    Hyperlipidemia    Osteoarthritis    Allergic rhinitis    Low back pain    T 12 compr fx 2009    Sleep Apnea    Breast cancer 2009    UTIs   Family History:    Reviewed history from 09/21/2007 and no changes required:       Family History Hypertension       CVA, CAD       Family History of Breast Cancer:Mother       Family History of Esophageal Cancer:Father       Family History of Colitis/Crohn's:Son       Family History of Heart Disease: Mother       Family History of Irritable Bowel Syndrome:Mother  Social History:    Reviewed history from 09/21/2007 and no changes required:       Occupation: Estate manager/land agent       Never Smoked       Divorced with 3 children    Review of Systems  The patient denies weight loss, chest pain, syncope, and abdominal pain.     Physical Exam  General:     overweight-appearing.   Head:     Normocephalic and atraumatic without obvious abnormalities. No apparent alopecia or balding. Nose:     External nasal examination shows no deformity or inflammation. Nasal mucosa are pink and moist without lesions or exudates. Mouth:     Oral mucosa and oropharynx without lesions or exudates.  Teeth in good  repair. Lungs:     Normal respiratory effort, chest expands symmetrically. Lungs are clear to auscultation, no crackles or wheezes. Heart:     Normal rate and regular rhythm. S1 and S2 normal without gallop, murmur, click, rub or other extra sounds. Abdomen:     Bowel sounds positive,abdomen soft and non-tender without masses, organomegaly or hernias noted. Msk:     No deformity or scoliosis noted of thoracic or lumbar spine.   Pulses:     R and L carotid,radial,femoral,dorsalis pedis and posterior tibial pulses are full and equal bilaterally Extremities:     No clubbing, cyanosis, edema, or deformity noted with normal full range of motion of all joints.   Neurologic:     No cranial nerve deficits noted. Station and gait are normal. Plantar reflexes are down-going bilaterally. DTRs are symmetrical throughout. Sensory, motor and coordinative functions appear intact. Skin:     Intact without suspicious lesions or rashes Psych:     Cognition and judgment appear intact. Alert and cooperative  with normal attention span and concentration. No apparent delusions, illusions, hallucinations    Impression & Recommendations:  Problem # 1:  BREAST CANCER, HX OF (ICD-V10.3) Assessment: New  Problem # 2:  GERD (ICD-530.81) Assessment: Unchanged  Her updated medication list for this problem includes:    Prilosec Otc 20 Mg Tbec (Omeprazole magnesium) ..... Once daily   Problem # 3:  LOW BACK PAIN (ICD-724.2) Assessment: Deteriorated  The following medications were removed from the medication list:    Darvocet-n 100 100-650 Mg Tabs (Propoxyphene n-apap) .Marland Kitchen... 1 by mouth qid as needed pain  Her updated medication list for this problem includes:    Aspirin Ec 81 Mg Tbec (Aspirin) ..... Once daily    Hydrocodone-acetaminophen 5-325 Mg Tabs (Hydrocodone-acetaminophen) .Marland Kitchen... 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 Take one tablet at bedtime   Problem # 6:  DIABETES MELLITUS, TYPE II (ICD-250.00) Assessment: Improved  Her updated medication list for this problem includes:    Metformin Hcl 500 Mg Tb24 (Metformin hcl) .Marland Kitchen... 1 po two times a day    Aspirin Ec 81 Mg Tbec (Aspirin) ..... Once daily   Complete Medication List: 1)  Prilosec Otc 20 Mg Tbec (Omeprazole magnesium) .... Once daily 2)  Metformin Hcl 500 Mg Tb24 (Metformin hcl) .Marland Kitchen.. 1 po two times a day 3)  Aspirin Ec 81 Mg Tbec (Aspirin) .... Once daily 4)  Cranberry 400 Mg Caps (Cranberry) .... As needed 5)  Garlic  .... Qd 6)  Calcium  7)  Cosamin Ds  .... Once daily 8)  Progest  .... Once daily 9)  Vit D  .... Once daily 10)  Mega Vit  .... Once daily 11)  Tumenic  .... Once daily 12)  Iron  .... Once daily 13)  Antioxidant  .... Once daily 14)  Simvastatin 40 Mg Tabs (Simvastatin) .... Take one tablet at bedtime 15)  Freestyle Test Strp (Glucose blood) .... Use as directed 16)  Ortho Micronor 0.35 Mg Tabs (Norethindrone (contraceptive)) .... One by mouth once daily for 21 days,off 7 days 17)  Hydrocodone-acetaminophen 5-325 Mg Tabs (Hydrocodone-acetaminophen) .Marland Kitchen.. 1 by mouth up to 4 times per day as needed for pain 18)  Align Caps (Misc intestinal flora regulat) .Marland Kitchen.. 1 by mouth once daily  as dirr   Patient Instructions: 1)  Please schedule a follow-up appointment in 3 months. 2)  BMP prior to visit, ICD-9: 3)  Hepatic Panel prior to visit, ICD-9: 250.00 4)  Lipid Panel prior to visit, ICD-9: 5)  TSH prior to visit, ICD-9:   Prescriptions: ALIGN  CAPS (MISC INTESTINAL FLORA REGULAT) 1 by mouth once daily  as dirr  #30 x 6   Entered and Authorized by:   Tresa Garter MD   Signed by:   Tresa Garter MD on 12/07/2007   Method used:    Print then Give to Patient   RxID:   [id number] HYDROCODONE-ACETAMINOPHEN 5-325 MG TABS (HYDROCODONE-ACETAMINOPHEN) 1 by mouth up to 4 times per day as needed for pain  #120 x 2   Entered and Authorized by:   Tresa Garter MD   Signed by:   Tresa Garter MD on 12/07/2007   Method used:   Print then Give  to Patient   RxID:   [id number]  ]

## 2010-03-13 NOTE — Progress Notes (Signed)
Summary: fax request  Phone Note Call from Patient Call back at Home Phone [phone]   Summary of Call: Pt lm on triage that her GYN--Dr. Eda Paschal needs her most recent calcium level faxed to [phone]. Initial call taken by: Lucious Groves CMA,  October 11, 2009 2:36 PM  Follow-up for Phone Call        Pt informed, labs faxed Follow-up by: Lamar Sprinkles, CMA,  October 11, 2009 5:20 PM

## 2010-03-13 NOTE — Letter (Signed)
[facility]   Im[facility] By: Sherian Rein 08/29/2009 08:46:34  _____________________________________________________________________  External Attachment:    Type:   Image     Comment:   External Document

## 2010-03-13 NOTE — Procedures (Signed)
Summary: Endoscopy/[place]  Endoscopy/[place]   Imported By: Sherian Rein 11/08/2009 07:40:16  _____________________________________________________________________  External Attachment:    Type:   Image     Comment:   External Document

## 2010-03-13 NOTE — Progress Notes (Signed)
[facility]   Imported By: Sherian Rein 11/08/2009 08:12:03  _____________________________________________________________________  External Attachment:    Type:   Image     Comment:   External Document

## 2010-03-13 NOTE — Procedures (Signed)
Summary: [facility]  [facility]/[place]   Imported By: Sherian Rein 11/08/2009 07:43:50  _____________________________________________________________________  External Attachment:    Type:   Image     Comment:   External Document

## 2010-03-13 NOTE — Assessment & Plan Note (Signed)
Summary: FU---STC   Vital Signs:  Patient profile:   70 year old female Height:      63 inches Weight:      202 pounds BMI:     35.91 O2 Sat:      95 % on Room air Temp:     98.2 degrees F oral Pulse rate:   76 / minute Pulse rhythm:   regular Resp:     16 per minute BP sitting:   112 / 78  (left arm) Cuff size:   large  Vitals Entered By: Lanier Prude, CMA(AAMA) (October 13, 2009 9:15 AM)  O2 Flow:  Room air CC: f/u Is Patient Diabetic? Yes Comments pt is not taking Garlic, Calcium, Vit D, Turmeric, Antioxident or Zyflamed.  Please remove from list   Primary Care Provider:  Jacinta Shoe, MD  CC:  f/u.  History of Present Illness: C/o R breast lump. Had a mammo in Aug. C/o choking sensation at times with a throat discomfort The patient presents for a follow up of hypertension, diabetes, hyperlipidemia   Current Medications (verified): 1)  Prilosec Otc 20 Mg  Tbec (Omeprazole Magnesium) .... Once Daily 2)  Metformin Hcl 500 Mg  Tb24 (Metformin Hcl) .Marland Kitchen.. 1 Po Two Times A Day 3)  Aspirin Ec 81 Mg  Tbec (Aspirin) .... Once Daily 4)  Ok-Garlic  Tabs (Garlic) .Marland Kitchen.. 1 Tab By Mouth Once Daily 5)  Calcium 1200 Mg .Marland Kitchen.. 1 By Mouth Qd 6)  Vitamin D 2000 Unit Caps (Cholecalciferol) .... 2-3 Tabs By Mouth Once Daily 7)  Multivitamins   Tabs (Multiple Vitamin) .Marland Kitchen.. 1 Tab By Mouth Once Daily 8)  Turmeric 475 Mg .Marland Kitchen.. 1 Tab Po Qd Once Daily 9)  Ferrous Sulfate 325 (65 Fe) Mg  Tabs (Ferrous Sulfate) .Marland Kitchen.. 1 Tab By Mouth Once Daily 10)  Antioxidant Formula  Caps (Multiple Vitamins-Minerals) .... 2 Tabs By Mouth Once Daily 11)  Simvastatin 40 Mg Tabs (Simvastatin) .... Take One Tablet At Bedtime 12)  Freestyle Lite Test  Strp (Glucose Blood) .... Test As Directed  Dx:250.00 13)  Mobic 15 Mg Tabs (Meloxicam) .... Take 1 As Needed For Pain. 14)  Allegra 180 Mg Tabs (Fexofenadine Hcl) .... Take 1 Tablet By Mouth Once A Day 15)  Nitrofurantoin Macrocrystal 100 Mg Caps (Nitrofurantoin  Macrocrystal) .... Take 1 Tablet By Mouth Once A Day 16)  Vesicare 10 Mg Tabs (Solifenacin Succinate) .Marland Kitchen.. 1 Once Daily 17)  Freestyle Lancets  Misc (Lancets) .... As Dirr 18)  Magnesium .Marland Kitchen.. 1-2 By Mouth As Needed 19)  Calcium and Magnesium Combined Tab .... 2 By Mouth Once Daily 20)  Omega-3 .Marland Kitchen.. 1 By Mouth Tid 21)  Zyflamend Softgel .Marland Kitchen.. 1 By Mouth Tid  Allergies (verified): 1)  ! Morphine Sulfate Cr (Morphine Sulfate) 2)  ! Sulfa  Past History:  Past Medical History: Last updated: 06/27/2008 Diabetes mellitus, type II GERD Dr Marina Goodell Hyperlipidemia Osteoarthritis Allergic rhinitis Low back pain T 12 compr fx 2009 Sleep Apnea Breast cancer 2009 XRT UTIs IBS murmur Breast cancer, hx of  Past Surgical History: Last updated: 06/27/2008 Bladder Repair With a sling Appendectomy Cholecystectomy Lumpectomy 2009  Family History: Family History Hypertension CVA, CAD Family History of Breast Cancer:Mother Family History of Esophageal Cancer:Father Family History of Colitis/Crohn's:Son Family History of Heart Disease: Mother Family History of Irritable Bowel Syndrome:Mother B PE  Social History: 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    Physical Exam  General:  NAD, overweight-appearing.   Ears:  R ear normal and L ear normal.   Mouth:  good dentition.  No masses Neck:  Supple; no masses or thyromegaly. Breasts:  L nl R with a lat. scar between 8 and 11 o'clock with a band like tissue from the sar and toward the nipple 3 cm long, NT and thin Lungs:  Normal respiratory effort, chest expands symmetrically. Lungs are clear to auscultation, no crackles or wheezes. Heart:  Normal rate and regular rhythm. S1 and S2 normal without gallop, murmur, click, rub or other extra  sounds. Abdomen:  Bowel sounds positive,abdomen soft and non-tender without masses, organomegaly or hernias noted. Msk:  Lumbar-sacral spine is tender to palpation over paraspinal muscles and painfull with the ROM R shoullder tender poster w/ROM  Neurologic:  No cranial nerve deficits noted. Station and gait are normal. Plantar reflexes are down-going bilaterally. DTRs are symmetrical throughout. Sensory, motor and coordinative functions appear intact. Skin:  Intact without suspicious lesions or rashes Cervical Nodes:  No lymphadenopathy noted Axillary Nodes:  No palpable lymphadenopathy   Impression & Recommendations:  Problem # 1:  BREAST CANCER, HX OF (ICD-V10.3) Assessment Comment Only  Problem # 2:  LUMP OR MASS IN BREAST (ICD-611.72) R ? scar tissue Assessment: New Surg appt is pending. Recent mammo was nl  Problem # 3:  DIABETES MELLITUS, TYPE II (ICD-250.00) Assessment: Unchanged  Her updated medication list for this problem includes:    Metformin Hcl 500 Mg Tb24 (Metformin hcl) .Marland Kitchen... 1 po two times a day    Aspirin Ec 81 Mg Tbec (Aspirin) ..... Once daily  Labs Reviewed: Creat: 0.8 (09/29/2009)    Reviewed HgBA1c results: 6.5 (09/29/2009)  6.3 (05/25/2009)  Problem # 4:  COMPRESSION FRACTURE, THORACIC VERTEBRA (ICD-805.2) h/o Assessment: Comment Only  Problem # 5:  OSTEOARTHRITIS (ICD-715.90) Assessment: Unchanged  Her updated medication list for this problem includes:    Aspirin Ec 81 Mg Tbec (Aspirin) ..... Once daily    Mobic 15 Mg Tabs (Meloxicam) .Marland Kitchen... Take 1 as needed for pain.  Problem # 6:  HYPERLIPIDEMIA (ICD-272.4) Assessment: Unchanged  Her updated medication list for this problem includes:    Simvastatin 40 Mg Tabs (Simvastatin) .Marland Kitchen... Take one tablet at bedtime  Problem # 7:  DYSPHAGIA UNSPECIFIED (ICD-787.20) Assessment: New  Orders: Gastroenterology Referral (GI)  Problem # 8:  GERD (ICD-530.81)  Her updated medication list for this  problem includes:    Prilosec Otc 20 Mg Tbec (Omeprazole magnesium) ..... Once daily HOLD    Nexium 40 Mg Cpdr (Esomeprazole magnesium) .Marland Kitchen... 1 by mouth qam ( medically necessary)  Complete Medication List: 1)  Prilosec Otc 20 Mg Tbec (Omeprazole magnesium) .... Once daily 2)  Metformin Hcl 500 Mg Tb24 (Metformin hcl) .Marland Kitchen.. 1 po two times a day 3)  Aspirin Ec 81 Mg Tbec (Aspirin) .... Once daily 4)  Ok-garlic Tabs (Garlic) .Marland Kitchen.. 1 tab by mouth once daily 5)  Calcium 1200 Mg  .Marland KitchenMarland Kitchen. 1 by mouth qd 6)  Vitamin D 2000 Unit Caps (Cholecalciferol) .... 2-3 tabs by mouth once daily 7)  Multivitamins Tabs (Multiple vitamin) .Marland Kitchen.. 1 tab by mouth once daily 8)  Turmeric 475 Mg  .Marland Kitchen.. 1 tab po qd once daily 9)  Ferrous Sulfate 325 (65 Fe) Mg Tabs (Ferrous sulfate) .Marland Kitchen.. 1 tab by mouth once daily 10)  Antioxidant Formula  Caps (Multiple vitamins-minerals) .... 2 tabs by mouth once daily 11)  Simvastatin 40 Mg Tabs (Simvastatin) .... Take one tablet at bedtime 12)  Freestyle Lite Test Strp (Glucose blood) .... Test as directed  dx:250.00 13)  Mobic 15 Mg Tabs (Meloxicam) .... Take 1 as needed for pain. 14)  Allegra 180 Mg Tabs (Fexofenadine hcl) .... Take 1 tablet by mouth once a day 15)  Nitrofurantoin Macrocrystal 100 Mg Caps (Nitrofurantoin macrocrystal) .... Take 1 tablet by mouth once a day 16)  Vesicare 10 Mg Tabs (Solifenacin succinate) .Marland Kitchen.. 1 once daily 17)  Freestyle Lancets Misc (Lancets) .... As dirr 18)  Magnesium  .Marland Kitchen.. 1-2 by mouth as needed 19)  Calcium and Magnesium Combined Tab  .... 2 by mouth once daily 20)  Omega-3  .Marland Kitchen.. 1 by mouth tid 21)  Zyflamend Softgel  .Marland Kitchen.. 1 by mouth tid 22)  Nexium 40 Mg Cpdr (Esomeprazole magnesium) .Marland Kitchen.. 1 by mouth qam ( medically necessary) 23)  Sudafed 12 Hour 120 Mg Xr12h-tab (Pseudoephedrine hcl) .Marland Kitchen.. 1 by mouth two times a day as needed allergies  Contraindications/Deferment of Procedures/Staging:    Test/Procedure: FLU VAX    Reason for deferment: patient  declined   Patient Instructions: 1)  Please schedule a follow-up appointment in 3 months. 2)  BMP prior to visit, ICD-9: 3)  Hepatic Panel prior to visit, ICD-9: 995.20  250.00 4)  Lipid Panel prior to visit, ICD-9: 5)  HbgA1C prior to visit, ICD-9: 6)  Use the Sinus rinse as needed  Prescriptions: SUDAFED 12 HOUR 120 MG XR12H-TAB (PSEUDOEPHEDRINE HCL) 1 by mouth two times a day as needed allergies  #60 x 1   Entered by:   Lamar Sprinkles, CMA   Authorized by:   Tresa Garter MD   Signed by:   Lamar Sprinkles, CMA on 10/13/2009   Method used:   Electronically to        [facility]* (retail)       [address]       [place], [place]  [id number]       Ph: [phone]       Fax: [phone]   RxID:   [id number] SUDAFED 12 HOUR 120 MG XR12H-TAB (PSEUDOEPHEDRINE HCL) 1 by mouth two times a day as needed allergies  #60 x 1   Entered and Authorized by:   Tresa Garter MD   Signed by:   Tresa Garter MD on 10/13/2009   Method used:   Print then Give to Patient   RxID:   [id number] MOBIC 15 MG TABS (MELOXICAM) take 1 as needed for pain.  #90 x 1   Entered and Authorized by:   Tresa Garter MD   Signed by:   Tresa Garter MD on 10/13/2009   Method used:   Print then Give to Patient   RxID:   [id number] NITROFURANTOIN MACROCRYSTAL 100 MG CAPS (NITROFURANTOIN MACROCRYSTAL) Take 1 tablet by mouth once a day  #90 x 3   Entered and Authorized by:   Tresa Garter MD   Signed by:   Tresa Garter MD on 10/13/2009   Method used:   Print then Give to Patient   RxID:   [id number] NEXIUM 40 MG CPDR (ESOMEPRAZOLE MAGNESIUM) 1 by mouth qam ( medically necessary)  #30 x 12   Entered and Authorized by:   Tresa Garter MD   Signed by:   Tresa Garter MD on 10/13/2009   Method used:  Print then Give to Patient   RxID:   [id number]

## 2010-03-13 NOTE — Progress Notes (Signed)
[facility]   Imported By: Sherian Rein 11/08/2009 08:09:24  _____________________________________________________________________  External Attachment:    Type:   Image     Comment:   External Document

## 2010-03-13 NOTE — Assessment & Plan Note (Signed)
Summary: f104m/dm  Medications Added OK-GARLIC  TABS (GARLIC) 1 tab by mouth once daily VITAMIN D 2000 UNIT CAPS (CHOLECALCIFEROL) 2 tabs by mouth once daily MULTIVITAMINS   TABS (MULTIPLE VITAMIN) 1 tab by mouth once daily FERROUS SULFATE 325 (65 FE) MG  TABS (FERROUS SULFATE) 1 tab by mouth once daily ANTIOXIDANT FORMULA  CAPS (MULTIPLE VITAMINS-MINERALS) 2 tabs by mouth once daily MOBIC 15 MG TABS (MELOXICAM) take 1 as needed for pain.      Allergies Added:   Primary Provider:  Jacinta Shoe, MD  CC:  6 month follow up.  History of Present Illness: Kathyrn is seen today for F/U of elevated lipids, murmur and dyspnea.  CRF include marked positive family history and DM.  Her diabetes has been under good control with HbA1c 6.5.  Her LDL has been under 80 with normal LFT's She has had some exertional SOB and "tightness" expecially when walking up hill.  She was busy doing this for the past week at the [place] figure skating show.  She is also busy at [employer].  She denies cough, sputum or fever.  She is not SOB at rest.  She has been compliant withher meds and previous echo in 2009 showed normal EF with no sig. valve abnormality.  Myovue in 2008 was normal  Current Problems (verified): 1)  Unspecified Vitamin D Deficiency  (ICD-268.9) 2)  Murmur  (ICD-785.2) 3)  Palpitations  (ICD-785.1) 4)  Breast Cancer, Hx of  (ICD-V10.3) 5)  Constipation  (ICD-564.00) 6)  Dysphagia  (ICD-787.29) 7)  Gerd  (ICD-530.81) 8)  Osteoarthritis  (ICD-715.90) 9)  Osteopenia  (ICD-733.90) 10)  Compression Fracture, Thoracic Vertebra  (ICD-805.2) 11)  Low Back Pain  (ICD-724.2) 12)  Allergic Rhinitis  (ICD-477.9) 13)  Flatulence  (ICD-787.3) 14)  Lipoma Nec  (ICD-214.8) 15)  Basal Cell Lession  () 16)  Obstructive Sleep Apnea  (ICD-327.23) 17)  Bladder Irritability  (ICD-596.8) 18)  Vaginitis, Atrophic  (ICD-627.3) 19)  Iron Deficiency  (ICD-280.9) 20)  Osteoarthritis  (ICD-715.90) 21)   Hyperlipidemia  (ICD-272.4) 22)  Gerd  (ICD-530.81) 23)  Diabetes Mellitus, Type II  (ICD-250.00)  Current Medications (verified): 1)  Prilosec Otc 20 Mg  Tbec (Omeprazole Magnesium) .... Once Daily 2)  Metformin Hcl 500 Mg  Tb24 (Metformin Hcl) .Marland Kitchen.. 1 Po Two Times A Day 3)  Aspirin Ec 81 Mg  Tbec (Aspirin) .... Once Daily 4)  Ok-Garlic  Tabs (Garlic) .Marland Kitchen.. 1 Tab By Mouth Once Daily 5)  Calcium 1200 Mg .Marland Kitchen.. 1 By Mouth Qd 6)  Vitamin D 2000 Unit Caps (Cholecalciferol) .... 2 Tabs By Mouth Once Daily 7)  Multivitamins   Tabs (Multiple Vitamin) .Marland Kitchen.. 1 Tab By Mouth Once Daily 8)  Turmeric 475 Mg .Marland Kitchen.. 1 Tab Po Qd Once Daily 9)  Ferrous Sulfate 325 (65 Fe) Mg  Tabs (Ferrous Sulfate) .Marland Kitchen.. 1 Tab By Mouth Once Daily 10)  Antioxidant Formula  Caps (Multiple Vitamins-Minerals) .... 2 Tabs By Mouth Once Daily 11)  Simvastatin 40 Mg Tabs (Simvastatin) .... Take One Tablet At Bedtime 12)  Freestyle Lite Test  Strp (Glucose Blood) .... Test As Directed  Dx:250.00 13)  Mobic 15 Mg Tabs (Meloxicam) .... Take 1 As Needed For Pain. 14)  Allegra 180 Mg Tabs (Fexofenadine Hcl) .... Take 1 Tablet By Mouth Once A Day 15)  Nitrofurantoin Macrocrystal 100 Mg Caps (Nitrofurantoin Macrocrystal) .... Take 1 Tablet By Mouth Once A Day 16)  Vesicare 10 Mg Tabs (Solifenacin Succinate) .Marland Kitchen.. 1 Once  Daily 17)  Freestyle Lancets  Misc (Lancets) .... As Dirr  Allergies (verified): 1)  ! Morphine Sulfate Cr (Morphine Sulfate) 2)  ! Sulfa  Past History:  Past Medical History: Last updated: 06/27/2008 Diabetes mellitus, type II GERD Dr Marina Goodell Hyperlipidemia Osteoarthritis Allergic rhinitis Low back pain T 12 compr fx 2009 Sleep Apnea Breast cancer 2009 XRT UTIs IBS murmur Breast cancer, hx of  Past Surgical History: Last updated: 06/27/2008 Bladder Repair With a sling Appendectomy Cholecystectomy Lumpectomy 2009  Family History: Last updated: 09/21/2007 Family History Hypertension CVA, CAD Family  History of Breast Cancer:Mother Family History of Esophageal Cancer:Father Family History of Colitis/Crohn's:Son Family History of Heart Disease: Mother Family History of Irritable Bowel Syndrome:Mother  Social History: Last updated: 06/27/2008 Occupation: Estate manager/land agent Never Smoked Divorced with 3 children Alcohol Use - no Drug Use - no Vegeterian  Review of Systems       Denies fever, malais, weight loss, blurry vision, decreased visual acuity, cough, sputum, hemoptysis, pleuritic pain, palpitaitons, heartburn, abdominal pain, melena, lower extremity edema, claudication, or rash. All other systems reveiwed and negative  Vital Signs:  Patient profile:   70 year old female Height:      62 inches Weight:      202 pounds BMI:     37.08 Pulse rate:   77 / minute Resp:     12 per minute BP sitting:   124 / 74  (left arm)  Vitals Entered By: Kem Parkinson (March 13, 2009 4:04 PM)  Physical Exam  General:  Affect appropriate Healthy:  appears stated age HEENT: normal Neck supple with no adenopathy JVP normal no bruits no thyromegaly Lungs clear with no wheezing and good diaphragmatic motion Heart:  S1/S2 systolic  murmur, no rub, gallop or click PMI normal Abdomen: benighn, BS positve, no tenderness, no AAA no bruit.  No HSM or HJR Distal pulses intact with no bruits No edema Neuro non-focal Skin warm and dry    Impression & Recommendations:  Problem # 1:  MURMUR (ICD-785.2) Benign with no valve issue on echo no need for SBE prohylaxis  Problem # 2:  PALPITATIONS (ICD-785.1) Resolved with no  recurrence.  No evidence of structural heart disease Her updated medication list for this problem includes:    Aspirin Ec 81 Mg Tbec (Aspirin) ..... Once daily  Orders: Nuclear Stress Test (Nuc Stress Test)  Problem # 3:  HYPERLIPIDEMIA (ICD-272.4) At goal with no side effects Her updated medication list for this problem includes:    Simvastatin 40  Mg Tabs (Simvastatin) .Marland Kitchen... Take one tablet at bedtime  CHOL: 158 (10/28/2008)   LDL: 76 (10/28/2008)   HDL: 60.10 (10/28/2008)   TG: 110.0 (10/28/2008)  Problem # 4:  DYSPNEA (ICD-786.05) Stress myovue to R/O ischemia in a diabetic with marked family history oand elevated lipids Her updated medication list for this problem includes:    Aspirin Ec 81 Mg Tbec (Aspirin) ..... Once daily  Patient Instructions: 1)  Your physician recommends that you schedule a follow-up appointment in: ONE YEAR 2)  Your physician has requested that you have an exercise stress myoview.  For further information please visit [email or web address]  Please follow instruction sheet, as given.

## 2010-03-13 NOTE — Procedures (Signed)
Summary: Upper Endoscopy  Patient: [name] [name] Note: All result statuses are Final unless otherwise noted.  Tests: (1) Upper Endoscopy (EGD)   EGD Upper Endoscopy       DONE     [facility]     [address]     [place], [place]  [address]           ENDOSCOPY PROCEDURE REPORT           PATIENT:  [name], [name]  MR#:  [id number]     BIRTHDATE:  March 04, 1940, 68 yrs. old  GENDER:  female           ENDOSCOPIST:  Wilhemina Bonito. Eda Keys, MD     Referred by:  Office           PROCEDURE DATE:  12/13/2009     PROCEDURE:  EGD, diagnostic 27035     ASA CLASS:  Class II     INDICATIONS:  epigastric pain ; substenal discomfort; globus     sensation           MEDICATIONS:   Fentanyl 25 mcg IV, Versed 5 mg IV     TOPICAL ANESTHETIC:  Exactacain Spray           DESCRIPTION OF PROCEDURE:   After the risks benefits and     alternatives of the procedure were thoroughly explained, informed     consent was obtained.  The LB GIF-H180 [id number] endoscope was     introduced through the mouth and advanced to the second portion of     the duodenum, without limitations.  The instrument was slowly     withdrawn as the mucosa was fully examined.     <<PROCEDUREIMAGES>>           The upper, middle, and distal third of the esophagus were     carefully inspected and no abnormalities were noted. The z-line     was well seen at the GEJ. The endoscope was pushed into the fundus     which was normal including a retroflexed view. The antrum,gastric     body, first and second part of the duodenum were unremarkable.     Retroflexed views revealed a hiatal hernia.    The scope was then     withdrawn from the patient and the procedure completed.           COMPLICATIONS:  None           ENDOSCOPIC IMPRESSION:     1) Normal EGD     2) A small hiatal hernia     3) GERD           RECOMMENDATIONS:     1) Anti-reflux regimen to be follow     2) continue Pantoprazole           ____________________________     Wilhemina Bonito. Eda Keys, MD           CC:  Roselina Hedges. Plotnikov, MD, The Patient           n.     eSIGNED:   Wilhemina Bonito. Eda Keys at 12/13/2009 03:09 PM           Shona Simpson, [id number]  Note: An exclamation mark (!) indicates a result that was not dispersed into the flowsheet. Document Creation Date: 12/13/2009 3:10 PM _______________________________________________________________________  (1) Order result status: Final Collection or observation date-time: 12/13/2009 15:05 Requested date-time:  Receipt date-time:  Reported date-time:  Referring Physician:  Ordering Physician: Fransico Setters [phone]) Specimen Source:  Source: Launa Grill Order Number: [id number] Lab site:

## 2010-03-13 NOTE — Letter (Signed)
Summary: EGD Instructions  [facility]  [address] [place], [place] [address]   Phone: [phone]  Fax: [phone]       LAGRETTA LOSEKE    [birth year]    MRN: [id number]       Procedure Day Dorna Bloom: Wednesday November 2nd, 2011     Arrival Time:  1:30pm     Procedure Time: 2:30pm     Location of Procedure:                    _ x _ [facility] (4th Floor)    PREPARATION FOR ENDOSCOPY   On 12/13/09 THE DAY OF THE PROCEDURE:  1.   No solid foods, milk or milk products are allowed after midnight the night before your procedure.  2.   Do not drink anything colored red or purple.  Avoid juices with pulp.  No orange juice.  3.  You may drink clear liquids until 12:30pm, which is 2 hours before your procedure.                                                                                                CLEAR LIQUIDS INCLUDE: Water Jello Ice Popsicles Tea (sugar ok, no milk/cream) Powdered fruit flavored drinks Coffee (sugar ok, no milk/cream) Gatorade Juice: apple, white grape, white cranberry  Lemonade Clear bullion, consomm, broth Carbonated beverages (any kind) Strained chicken noodle soup Hard Candy   MEDICATION INSTRUCTIONS  Unless otherwise instructed, you should take regular prescription medications with a small sip of water as early as possible the morning of your procedure.  Diabetic patients - see separate instructions.       OTHER INSTRUCTIONS  You will need a responsible adult at least 69 years of age to accompany you and drive you home.   This person must remain in the waiting room during your procedure.  Wear loose fitting clothing that is easily removed.  Leave jewelry and other valuables at home.  However, you may wish to bring a book to read or an iPod/MP3 player to listen to music as you wait for your procedure to start.  Remove all body piercing jewelry and leave at home.  Total time from sign-in until discharge  is approximately 2-3 hours.  You should go home directly after your procedure and rest.  You can resume normal activities the day after your procedure.  The day of your procedure you should not:   Drive   Make legal decisions   Operate machinery   Drink alcohol   Return to work  You will receive specific instructions about eating, activities and medications before you leave.    The above instructions have been reviewed and explained to me by   Marchelle Folks.     I fully understand and can verbalize these instructions _____________________________ Date _________

## 2010-03-13 NOTE — Procedures (Signed)
Summary: [employer]   Imported By: Sherian Rein 11/08/2009 07:28:17  _____________________________________________________________________  External Attachment:    Type:   Image     Comment:   External Document

## 2010-03-13 NOTE — Procedures (Signed)
Summary: Britt Boozer [facility]oon/[place]  Garren Edwards [facility]/[place]   Imported By: Sherian Rein 11/08/2009 07:49:27  _____________________________________________________________________  External Attachment:    Type:   Image     Comment:   External Document

## 2010-03-13 NOTE — Procedures (Signed)
Summary: Upper Endoscopy/[facility]  Upper Endoscopy/[facility]   Imported By: Sherian Rein 11/08/2009 07:29:41  _____________________________________________________________________  External Attachment:    Type:   Image     Comment:   External Document

## 2010-03-13 NOTE — Letter (Signed)
Summary: Denied/[facility]  Denied/[facility]   Imported By: Lester Ladonia 07/18/2009 11:43:22  _____________________________________________________________________  External Attachment:    Type:   Image     Comment:   External Document

## 2010-03-13 NOTE — Assessment & Plan Note (Signed)
Summary: Cardiology Nuclear Study  Nuclear Med Background Indications for Stress Test: Evaluation for Ischemia   History: Echo, Myocardial Perfusion Study  History Comments: '08 ZOX:WRUEAV, EF=67%; '09 Echo:normal LVF; h/o OSA, palpitations  Symptoms: Chest Tightness, Chest Tightness with Exertion, Diaphoresis, DOE, Fatigue, Rapid HR  Symptoms Comments: Last episode of CP:1 month ago.   Nuclear Pre-Procedure Cardiac Risk Factors: Family History - CAD, History of Smoking, Lipids, NIDDM Caffeine/Decaff Intake: none NPO After: 10:00 PM Lungs: Clear IV 0.9% NS with Angio Cath: 22g     IV Site: (L) Forearm IV Started by: Stanton Kidney EMT-P Chest Size (in) 42     Cup Size DDD     Height (in): 63 Weight (lb): 206 BMI: 36.62 Tech Comments: CBG =118 @ 8:45 am this day, Per Patient.  Nuclear Med Study 1 or 2 day study:  1 day     Stress Test Type:  Stress Reading MD:  Charlton Haws, MD     Referring MD:  Charlton Haws, MD Resting Radionuclide:  Technetium 22m Tetrofosmin     Resting Radionuclide Dose:  11 mCi  Stress Radionuclide:  Technetium 81m Tetrofosmin     Stress Radionuclide Dose:  33 mCi   Stress Protocol Exercise Time (min):  11:00 min     Max HR:  139 bpm     Predicted Max HR:  153 bpm  Max Systolic BP: 157 mm Hg     Percent Max HR:  90.85 %     METS: 7.0 Rate Pressure Product:  40981    Stress Test Technologist:  Rea College CMA-N     Nuclear Technologist:  Domenic Polite CNMT  Rest Procedure  Myocardial perfusion imaging was performed at rest 45 minutes following the intravenous administration of Myoview Technetium 71m Tetrofosmin.  Stress Procedure  The patient exercised for eleven minutes utilizing the modified Bruce protocol.  The patient stopped due to fatigue.  She did c/o chest tightness immediately post recovery, that was quickly relieved.  There were no significant ST-T wave changes.  Myoview was injected at peak exercise and myocardial perfusion imaging was  performed after a brief delay.  QPS Raw Data Images:  Normal; no motion artifact; normal heart/lung ratio. Stress Images:  NI: Uniform and normal uptake of tracer in all myocardial segments. Rest Images:  Normal homogeneous uptake in all areas of the myocardium. Subtraction (SDS):  Normal Transient Ischemic Dilatation:  .86  (Normal <1.22)  Lung/Heart Ratio:  .26  (Normal <0.45)  Quantitative Gated Spect Images QGS EDV:  65 ml QGS ESV:  13 ml QGS EF:  80 % QGS cine images:  normal  Findings Normal nuclear study      Overall Impression  Exercise Capacity: Good exercise capacity. BP Response: Normal blood pressure response. Clinical Symptoms: Fatigue ECG Impression: No significant ST segment change suggestive of ischemia. Overall Impression: Normal stress nuclear study.  Appended Document: Cardiology Nuclear Study normal nuclear  Appended Document: Cardiology Nuclear Study pt aware of results

## 2010-03-13 NOTE — Procedures (Signed)
Summary: Britt Boozer [facility]/[place]  Garren Edwards [facility]/[place]   Imported By: Sherian Rein 11/08/2009 08:06:40  _____________________________________________________________________  External Attachment:    Type:   Image     Comment:   External Document

## 2010-03-13 NOTE — Letter (Signed)
Summary: CMN for CPAP Supplies/[facility]  CMN for CPAP Supplies/[facility]   Imported By: Sherian Rein 09/20/2009 10:17:18  _____________________________________________________________________  External Attachment:    Type:   Image     Comment:   External Document

## 2010-03-13 NOTE — Assessment & Plan Note (Signed)
Summary: 4 MO ROV /NWS    Vital Signs:  Patient profile:   70 year old [name] Height:      63 inches Weight:      205 pounds BMI:     36.45 O2 Sat:      97 % on Room air Temp:     97.5 degrees F oral Pulse rate:   85 / minute BP sitting:   122 / 66  (left arm) Cuff size:   large  Vitals Entered By: Lucious Groves (May 31, 2009 2:07 PM)  O2 Flow:  Room air CC: 4 mo rtn ov--Pt just had stress test yesterday./kb Is Patient Diabetic? Yes Did you bring your meter with you today? No Pain Assessment Patient in pain? no        Primary Care Provider:  Jacinta Shoe, MD  CC:  4 mo rtn ov--Pt just had stress test yesterday./kb.  History of Present Illness: The patient presents for a follow up of hypertension, diabetes, hyperlipidemia   Current Medications (verified): 1)  Prilosec Otc 20 Mg  Tbec (Omeprazole Magnesium) .... Once Daily 2)  Metformin Hcl 500 Mg  Tb24 (Metformin Hcl) .Marland Kitchen.. 1 Po Two Times A Day 3)  Aspirin Ec 81 Mg  Tbec (Aspirin) .... Once Daily 4)  Ok-Garlic  Tabs (Garlic) .Marland Kitchen.. 1 Tab By Mouth Once Daily 5)  Calcium 1200 Mg .Marland Kitchen.. 1 By Mouth Qd 6)  Vitamin D 2000 Unit Caps (Cholecalciferol) .... 2-3 Tabs By Mouth Once Daily 7)  Multivitamins   Tabs (Multiple Vitamin) .Marland Kitchen.. 1 Tab By Mouth Once Daily 8)  Turmeric 475 Mg .Marland Kitchen.. 1 Tab Po Qd Once Daily 9)  Ferrous Sulfate 325 (65 Fe) Mg  Tabs (Ferrous Sulfate) .Marland Kitchen.. 1 Tab By Mouth Once Daily 10)  Antioxidant Formula  Caps (Multiple Vitamins-Minerals) .... 2 Tabs By Mouth Once Daily 11)  Simvastatin [name] Mg Tabs (Simvastatin) .... Take One Tablet At Bedtime 12)  Freestyle Lite Test  Strp (Glucose Blood) .... Test As Directed  Dx:250.00 13)  Mobic 15 Mg Tabs (Meloxicam) .... Take 1 As Needed For Pain. 14)  Allegra 180 Mg Tabs (Fexofenadine Hcl) .... Take 1 Tablet By Mouth Once A Day 15)  Nitrofurantoin Macrocrystal 100 Mg Caps (Nitrofurantoin Macrocrystal) .... Take 1 Tablet By Mouth Once A Day 16)  Vesicare 10 Mg Tabs  (Solifenacin Succinate) .Marland Kitchen.. 1 Once Daily 17)  Freestyle Lancets  Misc (Lancets) .... As Dirr 18)  Magnesium .Marland Kitchen.. 1-2 By Mouth As Needed 19)  Calcium and Magnesium Combined Tab .... 2 By Mouth Once Daily 20)  Omega-3 .Marland Kitchen.. 1 By Mouth Tid 21)  Zyflamend Softgel .Marland Kitchen.. 1 By Mouth Tid  Allergies (verified): 1)  ! Morphine Sulfate Cr (Morphine Sulfate) 2)  ! 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    Physical Exam  General:  NAD, overweight-appearing.   Nose:  no external deformity and no nasal discharge.   Mouth:  good dentition.   Lungs:  Normal respiratory effort, chest expands symmetrically. Lungs are clear to auscultation,  no crackles or wheezes. Heart:  Normal rate and regular rhythm. S1 and S2 normal without gallop, murmur, click, rub or other extra sounds. Abdomen:  Bowel sounds positive,abdomen soft and non-tender without masses, organomegaly or hernias noted. Msk:  Lumbar-sacral spine is tender to palpation over paraspinal muscles and painfull with the ROM R shoullder tender poster w/ROM  Neurologic:  No cranial nerve deficits noted. Station and gait are normal. Plantar reflexes are down-going bilaterally. DTRs are symmetrical throughout. Sensory, motor and coordinative functions appear intact. Skin:  Intact without suspicious lesions or rashes   Impression & Recommendations:  Problem # 1:  DIABETES MELLITUS, TYPE II (ICD-250.00) Assessment Unchanged  Her updated  medication list for this problem includes:    Metformin Hcl 500 Mg Tb24 (Metformin hcl) .Marland Kitchen... 1 po two times a day    Aspirin Ec 81 Mg Tbec (Aspirin) ..... Once daily  Labs Reviewed: Creat: 0.8 (05/25/2009)    Reviewed HgBA1c results: 6.3 (05/25/2009)  6.4 (01/19/2009)  Problem # 2:  HYPERLIPIDEMIA (ICD-272.4) Assessment: Improved  Her updated medication list for this problem includes:    Simvastatin [name] Mg Tabs (Simvastatin) .Marland Kitchen... Take one tablet at bedtime  Labs Reviewed: SGOT: 23 (05/25/2009)   SGPT: 20 (05/25/2009)   HDL:71.[name] (05/25/2009), 60.10 (10/28/2008)  LDL:93 (05/25/2009), 76 (10/28/2008)  Chol:183 (05/25/2009), 158 (10/28/2008)  Trig:91.0 (05/25/2009), 110.0 (10/28/2008)  Problem # 3:  LOW BACK PAIN (ICD-724.2) Assessment: Improved  Her updated medication list for this problem includes:    Aspirin Ec 81 Mg Tbec (Aspirin) ..... Once daily    Mobic 15 Mg Tabs (Meloxicam) .Marland Kitchen... Take 1 as needed for pain.  Problem # 4:  GERD (ICD-530.81) Assessment: Improved  Her updated medication list for this problem includes:    Prilosec Otc 20 Mg Tbec (Omeprazole magnesium) ..... Once daily  Problem # 5:  OSTEOARTHRITIS (ICD-715.90) Assessment: Unchanged  Her updated medication list for this problem includes:    Aspirin Ec 81 Mg Tbec (Aspirin) ..... Once daily    Mobic 15 Mg Tabs (Meloxicam) .Marland Kitchen... Take 1 as needed for pain.  Complete Medication List: 1)  Prilosec Otc 20 Mg Tbec (Omeprazole magnesium) .... Once daily 2)  Metformin Hcl 500 Mg Tb24 (Metformin hcl) .Marland Kitchen.. 1 po two times a day 3)  Aspirin Ec 81 Mg Tbec (Aspirin) .... Once daily 4)  Ok-garlic Tabs (Garlic) .Marland Kitchen.. 1 tab by mouth once daily 5)  Calcium 1200 Mg  .Marland KitchenMarland Kitchen. 1 by mouth qd 6)  Vitamin D 2000 Unit Caps (Cholecalciferol) .... 2-3 tabs by mouth once daily 7)  Multivitamins Tabs (Multiple vitamin) .Marland Kitchen.. 1 tab by mouth once daily 8)  Turmeric 475 Mg  .Marland Kitchen.. 1 tab po qd once daily 9)  Ferrous Sulfate 325 (65 Fe) Mg Tabs  (Ferrous sulfate) .Marland Kitchen.. 1 tab by mouth once daily 10)  Antioxidant Formula Caps (Multiple vitamins-minerals) .... 2 tabs by mouth once daily 11)  Simvastatin [name] Mg Tabs (Simvastatin) .... Take one tablet at bedtime 12)  Freestyle Lite Test Strp (Glucose blood) .... Test as directed  dx:250.00 13)  Mobic 15 Mg Tabs (Meloxicam) .... Take 1 as needed for pain. 14)  Allegra 180 Mg Tabs (Fexofenadine hcl) .... Take 1 tablet by mouth once a day 15)  Nitrofurantoin Macrocrystal 100 Mg Caps (Nitrofurantoin macrocrystal) .... Take 1 tablet by mouth once a day 16)  Vesicare 10 Mg Tabs (Solifenacin succinate) .Marland Kitchen.. 1 once daily 17)  Freestyle Lancets Misc (Lancets) .... As dirr 18)  Magnesium  .Marland Kitchen.. 1-2 by  mouth as needed 19)  Calcium and Magnesium Combined Tab  .... 2 by mouth once daily 20)  Omega-3  .Marland Kitchen.. 1 by mouth tid 21)  Zyflamend Softgel  .Marland Kitchen.. 1 by mouth tid  Patient Instructions: 1)  Please schedule a follow-up appointment in 4 months. 2)  BMP prior to visit, ICD-9: 3)  Lipid Panel prior to visit, ICD-9: 995.20 250.00 4)  HbgA1C prior to visit, ICD-9: 5)  Vit D 268.2 Prescriptions: FERROUS SULFATE 325 (65 FE) MG  TABS (FERROUS SULFATE) 1 tab by mouth once daily  #120 x 6   Entered and Authorized by:   Tresa Garter MD   Signed by:   Tresa Garter MD on 05/31/2009   Method used:   Print then Give to Patient   RxID:   [id number] CALCIUM AND MAGNESIUM COMBINED TAB 2 by mouth once daily  #120 x 6   Entered and Authorized by:   Tresa Garter MD   Signed by:   Tresa Garter MD on 05/31/2009   Method used:   Print then Give to Patient   RxID:   [id number] ZYFLAMEND SOFTGEL 1 by mouth tid  #120 x 6   Entered and Authorized by:   Tresa Garter MD   Signed by:   Tresa Garter MD on 05/31/2009   Method used:   Print then Give to Patient   RxID:   [id number] VITAMIN D 2000 UNIT CAPS (CHOLECALCIFEROL) 2-3 tabs by mouth once daily  #100 x 12    Entered and Authorized by:   Tresa Garter MD   Signed by:   Tresa Garter MD on 05/31/2009   Method used:   Print then Give to Patient   RxID:   [id number] TURMERIC 475 MG 1 tab po qd once daily  #200 x 3   Entered and Authorized by:   Tresa Garter MD   Signed by:   Tresa Garter MD on 05/31/2009   Method used:   Print then Give to Patient   RxID:   [id number] PRILOSEC OTC 20 MG  TBEC (OMEPRAZOLE MAGNESIUM) once daily  #126 x 3   Entered and Authorized by:   Tresa Garter MD   Signed by:   Tresa Garter MD on 05/31/2009   Method used:   Print then Give to Patient   RxID:   [id number]

## 2010-03-13 NOTE — Letter (Signed)
Summary: [name] [name]  [name]   Imported By: Sherian Rein 11/30/2009 11:56:09  _____________________________________________________________________  External Attachment:    Type:   Image     Comment:   External Document

## 2010-03-13 NOTE — Progress Notes (Signed)
Summary: Nuclear pre procedure  Phone Note Outgoing Call Call back at Home Phone [phone]   Call placed by: Rea College, CMA,  May 29, 2009 12:27 PM Call placed to: Patient Summary of Call: Reviewed information on [employer] Information Sheet (see scanned document for further details).  Spoke with patient.      Nuclear Med Background Indications for Stress Test: Evaluation for Ischemia   History: Echo, Myocardial Perfusion Study  History Comments: '08 JYN:WGNFAO, EF=67%; '09 Echo:normal LVF; h/o OSA, palpitations  Symptoms: Chest Tightness, DOE    Nuclear Pre-Procedure Cardiac Risk Factors: Family History - CAD, History of Smoking, Lipids, NIDDM Height (in): 62

## 2010-03-13 NOTE — Letter (Signed)
Summary: Diabetic Instructions  [facility]  [address] [place], [place] [address]   Phone: [phone]  Fax: [phone]    [name] [name] [birth year] MRN: [id number]   _ x _   ORAL DIABETIC MEDICATION INSTRUCTIONS  The day before your procedure:   Take your diabetic pill as you do normally  The day of your procedure:   Do not take your diabetic pill    We will check your blood sugar levels during the admission process and again in Recovery before discharging you home

## 2010-03-13 NOTE — Progress Notes (Signed)
  Phone Note Refill Request Message from:  Fax from Pharmacy on Jun 19, 2009 4:38 PM  Refills Requested: Medication #1:  NITROFURANTOIN MACROCRYSTAL 100 MG CAPS Take 1 tablet by mouth once a day    Prescriptions: NITROFURANTOIN MACROCRYSTAL 100 MG CAPS (NITROFURANTOIN MACROCRYSTAL) Take 1 tablet by mouth once a day  #90 x 3   Entered by:   Ami Bullins CMA   Authorized by:   Tresa Garter MD   Signed by:   Bill Salinas CMA on 06/19/2009   Method used:   Electronically to        [facility]* (retail)       [address]       [place], [place]  [id number]       Ph: [phone]       Fax: [phone]   RxID:   [id number]

## 2010-03-13 NOTE — Progress Notes (Signed)
[facility]   Imported By: Sherian Rein 11/08/2009 08:11:11  _____________________________________________________________________  External Attachment:    Type:   Image     Comment:   External Document

## 2010-03-13 NOTE — Assessment & Plan Note (Signed)
Summary: 3 MO ROV /NWS  #   Vital Signs:  Patient profile:   70 year old female Height:      63 inches Weight:      198 pounds BMI:     35.20 Temp:     99.5 degrees F oral Pulse rate:   88 / minute Pulse rhythm:   regular Resp:     16 per minute BP sitting:   126 / 80  (left arm) Cuff size:   large  Vitals Entered By: Lanier Prude, Beverly Gust) (January 17, 2010 8:47 AM) CC: 3 mo f/u  Is Patient Diabetic? Yes   Primary Care Provider:  Jacinta Shoe, MD  CC:  3 mo f/u .  History of Present Illness: The patient presents for a follow up of hypertension, diabetes, hyperlipidemia   Current Medications (verified): 1)  Metformin Hcl 500 Mg  Tb24 (Metformin Hcl) .Marland Kitchen.. 1 Po Two Times A Day 2)  Aspirin Ec 81 Mg  Tbec (Aspirin) .... Once Daily 3)  Vitamin D 2000 Unit Caps (Cholecalciferol) .... 2-3 Tabs By Mouth Once Daily 4)  Multivitamins   Tabs (Multiple Vitamin) .Marland Kitchen.. 1 Tab By Mouth Once Daily 5)  Ferrous Sulfate 325 (65 Fe) Mg  Tabs (Ferrous Sulfate) .Marland Kitchen.. 1 Tab By Mouth Once Daily 6)  Antioxidant Formula  Caps (Multiple Vitamins-Minerals) .... 2 Tabs By Mouth Once Daily 7)  Simvastatin 40 Mg Tabs (Simvastatin) .... Take One Tablet At Bedtime 8)  Freestyle Lite Test  Strp (Glucose Blood) .... Test As Directed  Dx:250.00 9)  Mobic 15 Mg Tabs (Meloxicam) .... Take 1 As Needed For Pain. 10)  Allegra 180 Mg Tabs (Fexofenadine Hcl) .... Take 1 Tablet By Mouth Once A Day 11)  Nitrofurantoin Macrocrystal 100 Mg Caps (Nitrofurantoin Macrocrystal) .... Take 1 Tablet By Mouth Every Other Day 12)  Vesicare 10 Mg Tabs (Solifenacin Succinate) .Marland Kitchen.. 1 Once Daily 13)  Freestyle Lancets  Misc (Lancets) .... As Dirr 14)  Magnesium .Marland Kitchen.. 1-2 By Mouth As Needed 15)  Calcium and Magnesium Combined Tab .... 2 By Mouth Once Daily 16)  Omega-3 .Marland Kitchen.. 1 By Mouth Tid 17)  Zyflamend Softgel .Marland Kitchen.. 1 By Mouth Two Times A Day 18)  Pantoprazole Sodium 40 Mg Tbec (Pantoprazole Sodium) .... One Tablet By Mouth  Once Daily 19)  Sudafed 12 Hour 120 Mg Xr12h-Tab (Pseudoephedrine Hcl) .Marland Kitchen.. 1 By Mouth Two Times A Day As Needed Allergies  Allergies (verified): 1)  ! Morphine Sulfate Cr (Morphine Sulfate) 2)  ! Sulfa  Past History:  Past Medical History: Last updated: 06/27/2008 Diabetes mellitus, type II GERD Dr Marina Goodell Hyperlipidemia Osteoarthritis Allergic rhinitis Low back pain T 12 compr fx 2009 Sleep Apnea Breast cancer 2009 XRT UTIs IBS murmur Breast cancer, hx of  Social History: Reviewed history from 10/13/2009 and no changes required. 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    Physical Exam  General:  NAD, overweight-appearing.   Mouth:  good dentition.  No masses Lungs:  Normal respiratory effort, chest expands symmetrically. Lungs are clear to auscultation, no crackles or wheezes. Heart:  Normal rate and regular rhythm. S1 and S2 normal without gallop, murmur, click, rub or other extra sounds. Abdomen:  Bowel sounds positive,abdomen soft and non-tender without masses, organomegaly or hernias noted. Msk:  Lumbar-sacral spine  is tender to palpation over paraspinal muscles and painfull with the ROM R shoullder tender poster w/ROM  Extremities:  No clubbing, cyanosis, edema, or deformity noted with normal full range of motion of all joints.   Neurologic:  No cranial nerve deficits noted. Station and gait are normal. Plantar reflexes are down-going bilaterally. DTRs are symmetrical throughout. Sensory, motor and coordinative functions appear intact. Skin:  Intact without suspicious lesions or rashes Psych:  Cognition and judgment appear intact. Alert and cooperative with normal attention span and concentration. No apparent delusions, illusions,  hallucinations   Impression & Recommendations:  Problem # 1:  DIABETES MELLITUS, TYPE II (ICD-250.00) Assessment Unchanged  Her updated medication list for this problem includes:    Metformin Hcl 500 Mg Tb24 (Metformin hcl) .Marland Kitchen... 1 po two times a day    Aspirin Ec 81 Mg Tbec (Aspirin) ..... Once daily  Problem # 2:  HYPERLIPIDEMIA (ICD-272.4) Assessment: Unchanged  Her updated medication list for this problem includes:    Simvastatin 40 Mg Tabs (Simvastatin) .Marland Kitchen... Take one tablet at bedtime  Problem # 3:  OSTEOPENIA (ICD-733.90) Assessment: Unchanged  On Prolia now  Problem # 4:  ALLERGIC RHINITIS (ICD-477.9) Assessment: Unchanged  Her updated medication list for this problem includes:    Allegra 180 Mg Tabs (Fexofenadine hcl) .Marland Kitchen... Take 1 tablet by mouth once a day    Sudafed 12 Hour 120 Mg Xr12h-tab (Pseudoephedrine hcl) .Marland Kitchen... 1 by mouth two times a day as needed allergies  Complete Medication List: 1)  Metformin Hcl 500 Mg Tb24 (Metformin hcl) .Marland Kitchen.. 1 po two times a day 2)  Aspirin Ec 81 Mg Tbec (Aspirin) .... Once daily 3)  Vitamin D 2000 Unit Caps (Cholecalciferol) .... 2-3 tabs by mouth once daily 4)  Multivitamins Tabs (Multiple vitamin) .Marland Kitchen.. 1 tab by mouth once daily 5)  Ferrous Sulfate 325 (65 Fe) Mg Tabs (Ferrous sulfate) .Marland Kitchen.. 1 tab by mouth once daily 6)  Antioxidant Formula Caps (Multiple vitamins-minerals) .... 2 tabs by mouth once daily 7)  Simvastatin 40 Mg Tabs (Simvastatin) .... Take one tablet at bedtime 8)  Freestyle Lite Test Strp (Glucose blood) .... Test as directed  dx:250.00 9)  Mobic 15 Mg Tabs (Meloxicam) .... Take 1 as needed for pain. 10)  Allegra 180 Mg Tabs (Fexofenadine hcl) .... Take 1 tablet by mouth once a day 11)  Nitrofurantoin Macrocrystal 100 Mg Caps (Nitrofurantoin macrocrystal) .... Take 1 tablet by mouth every other day 12)  Vesicare 10 Mg Tabs (Solifenacin succinate) .Marland Kitchen.. 1 once daily 13)  Freestyle Lancets Misc (Lancets) .... As  dirr 14)  Magnesium  .Marland Kitchen.. 1-2 by mouth as needed 15)  Calcium and Magnesium Combined Tab  .... 2 by mouth once daily 16)  Omega-3  .Marland Kitchen.. 1 by mouth tid 17)  Zyflamend Softgel  .Marland Kitchen.. 1 by mouth two times a day 18)  Pantoprazole Sodium 40 Mg Tbec (Pantoprazole sodium) .... One tablet by mouth once daily 19)  Sudafed 12 Hour 120 Mg Xr12h-tab (Pseudoephedrine hcl) .Marland Kitchen.. 1 by mouth two times a day as needed allergies  Patient Instructions: 1)  Please schedule a follow-up appointment in 3 months. 2)  BMP prior to visit, ICD-9: 3)  Hepatic Panel prior to visit, ICD-9: 4)  Lipid Panel prior to visit, ICD-9: 5)  HbgA1C prior to visit, ICD-9: 6)  Vit D 733.0   Orders Added: 1)  Est. Patient Level IV [16109]   Immunization History:  Influenza Immunization History:    Influenza:  historical (  11/21/2009)   Contraindications/Deferment of Procedures/Staging:    Test/Procedure: Pneumovax vaccine    Reason for deferment: patient declined   Immunization History:  Influenza Immunization History:    Influenza:  Historical (11/21/2009)

## 2010-03-13 NOTE — Procedures (Signed)
Summary: Colonoscopy/[facility]  Colonoscopy/[facility]   Imported By: Sherian Rein 11/08/2009 07:39:00  _____________________________________________________________________  External Attachment:    Type:   Image     Comment:   External Document

## 2010-03-13 NOTE — Letter (Signed)
[facility]   Imported By: Sherian Rein 03/09/2009 13:26:06  _____________________________________________________________________  External Attachment:    Type:   Image     Comment:   External Document

## 2010-03-15 NOTE — Assessment & Plan Note (Signed)
Summary: Epigastric/chest pain    History of Present Illness Visit Type: Follow-up Visit Primary GI MD: Yancey Flemings MD Primary Provider: Jacinta Shoe, MD Requesting Provider: Jacinta Shoe, MD Chief Complaint: Epigastric pain for one year. Family history of esophageal cancer History of Present Illness:   70 year old white female, sister of Rinaldo Ratel, with a history of type 2 diabetes mellitus, hyperlipidemia, obesity, osteoarthritis, low back pain due to compression fractures, sleep apnea, and GERD. She was last seen in the office August 2009 regarding nausea, worsening reflux symptoms, mild dysphagia, and constipation. There is a history of Barrett's esophagus in her brother and father. As well father with adenocarcinoma of the esophagus at age 38. The patient's Prilosec OTC was increased to 20 mg b.i.d. Upper endoscopy was subsequently performed August 20. This revealed mild esophagitis and a small I will hernia. No other abnormalities. She has not been seen since. She reports to me at this time a greater than one-year history of epigastric discomfort. She describes this poorly. She states that it generally radiates up into the sternum. She experiences this once every few weeks it tends to last for 10-15 minutes. Occasionally a burning discomfort heard it can occur at different times of the day. There is associated diaphoresis. No shortness of breath. No exertional component. Symptoms are not readily made worse or improved by anything in particular. She does report a globus type sensation as opposed to true dysphagia. She is currently on Nexium 40 mg daily over the past 3 weeks (samples given). She is concerned given her family history. The discomfort has not changed in terms of frequency or intensity. No alarm features such as weight loss or bleeding. She does have regular checkups with her primary provider, Dr. Posey Rea. Her last colonoscopy, in 2006, was negative. She is status post  cholecystectomy.   GI Review of Systems    Reports abdominal pain and  acid reflux.     Location of  Abdominal pain: epigastric area.    Denies belching, bloating, chest pain, dysphagia with liquids, dysphagia with solids, heartburn, loss of appetite, nausea, vomiting, vomiting blood, weight loss, and  weight gain.      Reports constipation.     Denies anal fissure, black tarry stools, change in bowel habit, diarrhea, diverticulosis, fecal incontinence, heme positive stool, hemorrhoids, irritable bowel syndrome, jaundice, light color stool, liver problems, rectal bleeding, and  rectal pain.    Current Medications (verified): 1)  Metformin Hcl 500 Mg  Tb24 (Metformin Hcl) .Marland Kitchen.. 1 Po Two Times A Day 2)  Aspirin Ec 81 Mg  Tbec (Aspirin) .... Once Daily 3)  Vitamin D 2000 Unit Caps (Cholecalciferol) .... 2-3 Tabs By Mouth Once Daily 4)  Multivitamins   Tabs (Multiple Vitamin) .Marland Kitchen.. 1 Tab By Mouth Once Daily 5)  Ferrous Sulfate 325 (65 Fe) Mg  Tabs (Ferrous Sulfate) .Marland Kitchen.. 1 Tab By Mouth Once Daily 6)  Antioxidant Formula  Caps (Multiple Vitamins-Minerals) .... 2 Tabs By Mouth Once Daily 7)  Simvastatin 40 Mg Tabs (Simvastatin) .... Take One Tablet At Bedtime 8)  Freestyle Lite Test  Strp (Glucose Blood) .... Test As Directed  Dx:250.00 9)  Mobic 15 Mg Tabs (Meloxicam) .... Take 1 As Needed For Pain. 10)  Allegra 180 Mg Tabs (Fexofenadine Hcl) .... Take 1 Tablet By Mouth Once A Day 11)  Nitrofurantoin Macrocrystal 100 Mg Caps (Nitrofurantoin Macrocrystal) .... Take 1 Tablet By Mouth Once A Day 12)  Vesicare 10 Mg Tabs (Solifenacin Succinate) .Marland Kitchen.. 1 Once Daily 13)  Freestyle Lancets  Misc (Lancets) .... As Dirr 14)  Magnesium .Marland Kitchen.. 1-2 By Mouth As Needed 15)  Calcium and Magnesium Combined Tab .... 2 By Mouth Once Daily 16)  Omega-3 .Marland Kitchen.. 1 By Mouth Tid 17)  Zyflamend Softgel .Marland Kitchen.. 1 By Mouth Tid 18)  Nexium 40 Mg Cpdr (Esomeprazole Magnesium) .Marland Kitchen.. 1 By Mouth Qam ( Medically Necessary) 19)  Sudafed  12 Hour 120 Mg Xr12h-Tab (Pseudoephedrine Hcl) .Marland Kitchen.. 1 By Mouth Two Times A Day As Needed Allergies  Allergies (verified): 1)  ! Morphine Sulfate Cr (Morphine Sulfate) 2)  ! Sulfa  Past History:  Past Medical History: Reviewed history from 06/27/2008 and no changes required. Diabetes mellitus, type II GERD Dr Marina Goodell Hyperlipidemia Osteoarthritis Allergic rhinitis Low back pain T 12 compr fx 2009 Sleep Apnea Breast cancer 2009 XRT UTIs IBS murmur Breast cancer, hx of  Past Surgical History: Reviewed history from 06/27/2008 and no changes required. Bladder Repair With a sling Appendectomy Cholecystectomy Lumpectomy 2009  Family History: Family History Hypertension CVA, CAD Family History of Breast Cancer:Mother Family History of Esophageal Cancer:Father Family History of Colitis/Crohn's:Brother ,Son Family History of Heart Disease: Mother Family History of Irritable Bowel Syndrome:Mother B PE  Social History: Reviewed history from 10/13/2009 and no changes required. 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  The patient denies allergy/sinus, anemia, anxiety-new, blood in urine, change in vision, confusion, cough, coughing up blood, depression-new, fainting, fatigue, fever, hearing problems, heart rhythm changes, itching, menstrual pain, night sweats, nosebleeds, pregnancy symptoms, shortness of breath, skin rash, sore throat, swelling of feet/legs, swollen lymph glands, thirst - excessive, urination changes/pain, vision changes, and voice change.    Vital Signs:  Patient profile:   70 year old female Height:      63 inches Weight:      200.38 pounds BMI:      35.62 Pulse rate:   72 / minute Pulse rhythm:   regular BP sitting:   116 / 70  (left arm) Cuff size:   regular  Vitals Entered By: June McMurray CMA Duncan Dull) (November 07, 2009 8:47 AM)  Physical Exam  General:  Well developed, obese,well nourished, no acute distress. Head:  Normocephalic and atraumatic. Eyes:  PERRLA, no icterus. Mouth:  No deformity or lesions, dentition normal. Neck:  Supple; no masses or thyromegaly. Lungs:  Clear throughout to auscultation. Heart:  Regular rate and rhythm; no murmurs, rubs,  or bruits. Abdomen:  Soft, obese,nontender and nondistended. No masses, hepatosplenomegaly or hernias noted. Normal bowel sounds. Msk:  Symmetrical with no gross deformities. Normal posture. Pulses:  Normal pulses noted. Extremities:  No  edema or deformities noted. Neurologic:  Alert and  oriented x4. Skin:  Intact without significant lesions or rashes. Psych:  Alert and cooperative. Normal mood and affect.   Impression & Recommendations:  Problem # 1:  ABDOMINAL PAIN, EPIGASTRIC (ICD-789.06) Vague recurrent epigastric/chest pain which has been stable for at least one year. No alarm features. On chronic PPI. Status post cholecystectomy. Etiology unclear. Some patient anxiety regarding occult esophageal or gastric pathology. Known GERD.  Plan: #1. Prescribed pantoprazole 40 mg daily(generic cheaper for patient) #2. Reflux precautions with attention to weight loss #3. Upper endoscopy to further evaluate pain #4. Hold diabetic  medications the morning of the procedure  Problem # 2:  CHEST PAIN UNSPECIFIED (ICD-786.50) please see above  Problem # 3:  DYSPHAGIA UNSPECIFIED (ICD-787.20) globus sensation. Could be GERD. Could be anxiety.  Problem # 4:  DIABETES MELLITUS, TYPE II (ICD-250.00) hold diabetic medications for the procedure  Problem # 5:  SCREENING COLORECTAL-CANCER (ICD-V76.51) last examination 2006. Screening up-to-date.  Other Orders: EGD  (EGD)  Patient Instructions: 1)  You prescription has been sent to your pharmacy.  2)  Upper Endoscopy brochure given.  3)  Copy sent to : Jacinta Shoe, MD 4)  The medication list was reviewed and reconciled.  All changed / newly prescribed medications were explained.  A complete medication list was provided to the patient / caregiver. Prescriptions: PANTOPRAZOLE SODIUM 40 MG TBEC (PANTOPRAZOLE SODIUM) one tablet by mouth once daily  #90 x 3   Entered by:   Christie Nottingham CMA (AAMA)   Authorized by:   Hilarie Fredrickson MD   Signed by:   Christie Nottingham CMA (AAMA) on 11/07/2009   Method used:   Electronically to        [facility]* (retail)       [address]       [place], [place]  [id number]       Ph: [phone]       Fax: [phone]   RxID:   [id number]

## 2010-03-15 NOTE — Letter (Signed)
Summary: Pierce Crane MD/[facility]  Pierce Crane MD/[facility]   Imported By: Lester Ardmore 02/28/2010 08:17:27  _____________________________________________________________________  External Attachment:    Type:   Image     Comment:   External Document

## 2010-03-15 NOTE — Assessment & Plan Note (Signed)
Summary: PER CHECK OUT/SF  Medications Added VITAMIN D 2000 UNIT CAPS (CHOLECALCIFEROL) 1 tab by mouth once daily MULTIVITAMINS   TABS (MULTIPLE VITAMIN) 1 tab weekly MOBIC 15 MG TABS (MELOXICAM) 1 tab by mouth once daily NEXIUM 40 MG CPDR (ESOMEPRAZOLE MAGNESIUM) 1 tab by mouth once daily      Allergies Added:   Primary Provider:  Jacinta Shoe, MD  CC:  pt complains of alittle back pain which  radiated to her chest...  History of Present Illness: Lindas palpitations have improved.  She tends to get them at night still.  Event monitor bengn with mild ambient PAC's and PVCs .   Recent GI w/u with GERD.  Has episodes of "indigestion" about once /week.  Pain radiates to chest but is non anginal sounding.  Myovue done 4/11 was normal.  . Overall she's been doing well. she asked me if her murmur was significant.  I told her there were no significant valve problems on her echo.  She has a benign systolic ejection murmur.  She's been active.  He got a statement chest pain PND or orthopnea she has mild exertional dyspnea which is chronic and no syncope.  She continues to have some pain over the right breast from her surgery.  He did get radiation as well.  She sees Dr. Pierce Crane for followup of her breast cancer.  She will be moving to [facility] soon and may need new doctors in [employer]   Current Problems (verified): 1)  Screening Colorectal-cancer  (ICD-V76.51) 2)  Chest Pain Unspecified  (ICD-786.50) 3)  Abdominal Pain, Epigastric  (ICD-789.06) 4)  Dysphagia Unspecified  (ICD-787.20) 5)  Lump or Mass in Breast  (ICD-611.72) 6)  Dyspnea  (ICD-786.05) 7)  Unspecified Vitamin D Deficiency  (ICD-268.9) 8)  Murmur  (ICD-785.2) 9)  Palpitations  (ICD-785.1) 10)  Breast Cancer, Hx of  (ICD-V10.3) 11)  Constipation  (ICD-564.00) 12)  Dysphagia  (ICD-787.29) 13)  Gerd  (ICD-530.81) 14)  Osteoarthritis  (ICD-715.90) 15)  Osteopenia  (ICD-733.90) 16)  Compression Fracture,  Thoracic Vertebra  (ICD-805.2) 17)  Low Back Pain  (ICD-724.2) 18)  Allergic Rhinitis  (ICD-477.9) 19)  Flatulence  (ICD-787.3) 20)  Lipoma Nec  (ICD-214.8) 21)  Basal Cell Lession  () 22)  Obstructive Sleep Apnea  (ICD-327.23) 23)  Bladder Irritability  (ICD-596.8) 24)  Vaginitis, Atrophic  (ICD-627.3) 25)  Iron Deficiency  (ICD-280.9) 26)  Osteoarthritis  (ICD-715.90) 27)  Hyperlipidemia  (ICD-272.4) 28)  Gerd  (ICD-530.81) 29)  Diabetes Mellitus, Type II  (ICD-250.00)  Current Medications (verified): 1)  Metformin Hcl 500 Mg  Tb24 (Metformin Hcl) .Marland Kitchen.. 1 Po Two Times A Day 2)  Aspirin Ec 81 Mg  Tbec (Aspirin) .... Once Daily 3)  Vitamin D 2000 Unit Caps (Cholecalciferol) .Marland Kitchen.. 1 Tab By Mouth Once Daily 4)  Multivitamins   Tabs (Multiple Vitamin) .Marland Kitchen.. 1 Tab Weekly 5)  Ferrous Sulfate 325 (65 Fe) Mg  Tabs (Ferrous Sulfate) .Marland Kitchen.. 1 Tab By Mouth Once Daily 6)  Simvastatin 40 Mg Tabs (Simvastatin) .... Take One Tablet At Bedtime 7)  Freestyle Lite Test  Strp (Glucose Blood) .... Test As Directed  Dx:250.00 8)  Mobic 15 Mg Tabs (Meloxicam) .Marland Kitchen.. 1 Tab By Mouth Once Daily 9)  Allegra 180 Mg Tabs (Fexofenadine Hcl) .... Take 1 Tablet By Mouth Once A Day 10)  Nitrofurantoin Macrocrystal 100 Mg Caps (Nitrofurantoin Macrocrystal) .... Take 1 Tablet By Mouth Every Other Day 11)  Vesicare 10 Mg Tabs (Solifenacin Succinate) .Marland KitchenMarland KitchenMarland Kitchen  1 Once Daily 12)  Freestyle Lancets  Misc (Lancets) .... As Dirr 13)  Magnesium .Marland Kitchen.. 1-2 By Mouth As Needed 14)  Calcium and Magnesium Combined Tab .... 2 By Mouth Once Daily 15)  Omega-3 .Marland Kitchen.. 1 By Mouth Tid 16)  Nexium 40 Mg Cpdr (Esomeprazole Magnesium) .Marland Kitchen.. 1 Tab By Mouth Once Daily  Allergies (verified): 1)  ! Morphine Sulfate Cr (Morphine Sulfate) 2)  ! Sulfa  Past History:  Past Medical History: Last updated: 06/27/2008 Diabetes mellitus, type II GERD Dr Marina Goodell Hyperlipidemia Osteoarthritis Allergic rhinitis Low back pain T 12 compr fx 2009 Sleep  Apnea Breast cancer 2009 XRT UTIs IBS murmur Breast cancer, hx of  Past Surgical History: Last updated: 06/27/2008 Bladder Repair With a sling Appendectomy Cholecystectomy Lumpectomy 2009  Family History: Last updated: 11/07/2009 Family History Hypertension CVA, CAD Family History of Breast Cancer:Mother Family History of Esophageal Cancer:Father Family History of Colitis/Crohn's:Brother ,Son Family History of Heart Disease: Mother Family History of Irritable Bowel Syndrome:Mother B PE  Social History: Last updated: 01/17/2010 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   Vital Signs:  Patient profile:   70 year old female Height:      63 inches Weight:      198 pounds BMI:     35.20 Pulse rate:   68 / minute Resp:     16 per minute BP sitting:   130 / 70  (left arm)  Vitals Entered By: Kem Parkinson (February 21, 2010 10:06 AM)  Physical Exam  General:  Affect appropriate Healthy:  appears stated age HEENT: normal Neck supple with no adenopathy JVP normal no bruits no thyromegaly Lungs clear with no wheezing and good diaphragmatic motion Heart:  S1/S2 sytoloc  murmur,rub, gallop or click PMI normal Abdomen: benighn, BS positve, no tenderness, no AAA no bruit.  No HSM or HJR Distal pulses intact with no bruits No edema Neuro non-focal Skin warm and dry    Impression & Recommendations:  Problem # 1:  CHEST PAIN UNSPECIFIED (ICD-786.50) Atypical normal myovue 4/11 observe Her updated medication list for this problem includes:    Aspirin Ec 81 Mg Tbec (Aspirin) ..... Once  daily  Problem # 2:  DYSPNEA (ICD-786.05) Functional secondary to obesity and decondiitoning.  Normal EF by echo and myovue Her updated medication list for this problem includes:    Aspirin Ec 81 Mg Tbec (Aspirin) ..... Once daily  Problem # 3:  MURMUR (ICD-785.2) AV sclersosis  No SBE required.  Consdier F/U echo if exam or symtoms change  Problem # 4:  PALPITATIONS (ICD-785.1) Benign and stable.  Consider BB in future Her updated medication list for this problem includes:    Aspirin Ec 81 Mg Tbec (Aspirin) ..... Once daily  Orders: EKG w/ Interpretation (93000)  Patient Instructions: 1)  Your physician recommends that you schedule a follow-up appointment in: 6 MONTHS WITH DR Eden Emms 2)  Your physician recommends that you continue on your current medications as directed. Please refer to the Current Medication list given to you today.   EKG Report  Procedure date:  02/21/2010  Findings:      NSR 68 PAC's  Poor R wave progression

## 2010-03-15 NOTE — Letter (Signed)
Summary: [facility]   Imported By: Lennie Odor 02/28/2010 11:51:56  _____________________________________________________________________  External Attachment:    Type:   Image     Comment:   External Document

## 2010-03-29 ENCOUNTER — Other Ambulatory Visit: Payer: Self-pay | Admitting: Oncology

## 2010-03-29 DIAGNOSIS — Z9889 Other specified postprocedural states: Secondary | ICD-10-CM

## 2010-04-10 ENCOUNTER — Telehealth (INDEPENDENT_AMBULATORY_CARE_PROVIDER_SITE_OTHER): Payer: Self-pay | Admitting: *Deleted

## 2010-04-18 ENCOUNTER — Other Ambulatory Visit: Payer: Self-pay

## 2010-04-19 NOTE — Progress Notes (Signed)
  Phone Note Other Incoming   Request: Send information Summary of Call: Request recieved from [facility]. Forwarded to [employer]. Plotnikov

## 2010-04-24 LAB — GLUCOSE, CAPILLARY
Glucose-Capillary: 101 mg/dL — ABNORMAL HIGH (ref 70–99)
Glucose-Capillary: 91 mg/dL (ref 70–99)

## 2010-04-25 ENCOUNTER — Ambulatory Visit: Payer: Self-pay | Admitting: Internal Medicine

## 2010-06-26 NOTE — Op Note (Signed)
NAMEPAILYN, BELLEVUE               ACCOUNT NO.:  [id number]   MEDICAL RECORD NO.:  [id number]          PATIENT TYPE:  AMB   LOCATION:  DSC                          FACILITY:  [facility]   PHYSICIAN:  Thomas A. Cornett, M.D.DATE OF BIRTH:  January 27, 1941   DATE OF PROCEDURE:  10/27/2007  DATE OF DISCHARGE:                               OPERATIVE REPORT   PREOPERATIVE DIAGNOSIS:  Right breast ductal carcinoma in situ, right  upper outer quadrant.   POSTOPERATIVE DIAGNOSIS:  Right breast ductal carcinoma in situ, right  upper outer quadrant.   PROCEDURE:  1. Right breast needle-localized lumpectomy.  2. Right axillary sentinel lymph node mapping with injection of      methylene blue dye.   SURGEON:  Maisie Fus A. Cornett, MD   ANESTHESIA:  LMA with 0.25% Sensorcaine local.   ESTIMATED BLOOD LOSS:  20 mL.   SPECIMEN:  1. Right breast tissue with localizing wire and clip.  2. Two right axillary sentinel lymph nodes negative by touch prep.   INDICATIONS FOR PROCEDURE:  The patient is a 70 year old female found to  have right breast microcalcifications.  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  No invasive cancer was identified but we talked about  options include mastectomy and breast conserving therapy.  She wished to  undergo breast conserving therapy with lumpectomy.  Given the location,  I also recommended sentinel lymph node mapping at the same time.   DESCRIPTION OF PROCEDURE:  The patient was brought to the operating  suite after injection of technetium sulfur colloid intradermally into  the right breast.  She also underwent preop needle localization as well.  The patient was then brought to the operating room.  After induction of  general anesthesia and sterile prep, 4 mL of methylene blue dye were  injected in the right subareolar position.  This was massaged.  She was  then prepped and draped in a sterile fashion.  We then used a  curvilinear incision in the right axilla  after using NeoProbe to  identify the hot area in the right axilla.  After opening the axilla, we  were able to identify 2 sentinel nodes that were hot.  There were blue  lymphatics going to them as well.  These were negative by touch prep.  Through the same right upper outer quadrant incision, we were able to  pull the wire from the localizing procedure at the incision and excised  all tissue around it.  I took an additional medial margin and anterior  margin as well with the lumpectomy.  We took this all the way down the  subcutaneous tissues down to the pectoralis fascia.  Hemostasis was  achieved with cautery.  Specimen was radiographed and found be adequate  with clip, mass, and wire intact.  Irrigation was used and suctioned  out.  I  closed the deep layer with 3-0 Vicryl.  A 4-0 Monocryl used to close the  skin in a subcuticular fashion.  All final counts of sponge, needle, and  instruments were found to be correct at this portion of the case.  Dermabond was applied as final dressing.  The patient was then awakened  and taken to recovery in satisfactory condition.      Thomas A. Cornett, M.D.  Electronically Signed     TAC/MEDQ  D:  10/27/2007  T:  10/28/2007  Job:  [id number]   cc:   Georgina Quint. Plotnikov, MD  Rande Brunt. Eda Paschal, M.D.  Noralyn Pick. Eden Emms, MD, [facility]

## 2010-06-26 NOTE — Assessment & Plan Note (Signed)
[facility]                            CARDIOLOGY OFFICE NOTE   NAME:Mera, HARBOUR NORDMEYER                      MRN:          [id number]  DATE:10/06/2007                            DOB:          Oct 19, 1940    A 70 year old sent by Dr. Luisa Hart for preop clearance.  The patient has  history of hypercholesterolemia.   Dr. Luisa Hart felt he heard a murmur on exam.   The patient is a friend of Joe Dunbar's.  She has previously been seen  here in the clinic.  She had a stress Myoview on February 28, 2006, for  some dyspnea.   It was normal with an EF in excess of 65%.  Talking to the patient, she  is active.  She does not get chest pain, PND, orthopnea, palpitations,  or syncope.  She has been told once before that she had a murmur but no  one ever made much of it.   She has never had SBE or rheumatic fever.   The patient has early breast cancer and needs a right breast lumpectomy.   Her last major surgery involved her gallbladder back in 1974 and a  bladder sling in 2007.  She had no general anesthetic complications, no  bleeding diathesis.   The patient's review of systems is, otherwise, negative.   Her past medical history is remarkable for a previous appendectomy,  previous cholecystectomy, 3 vaginal deliveries, and a bladder sling.   The patient is allergic to MORPHINE and SULFA.   She is on simvastatin 40 a day, meloxicam 15 a day, senna, and turmeric  extract.   The patient is divorced; however, she gets along with her ex-husband.  She has 3 sons, 2 live in the area and 1 grandson.  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  She quit smoking in 1969 and then  again in 1978.   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   Her review of system is, otherwise, remarkable for borderline type 2  diabetes, history of UTIs, some constipation, and seasonal allergies.  Mother died at age 36 of congestive heart  failure.  Father died at age  51 of cancer.   The patient's medications include VESIcare 10 mg a day, simvastatin 40 a  day, Prilosec over-the-counter, and meloxicam 15 a day.   PHYSICAL EXAMINATION:  GENERAL:  Her exam is remarkable for a pleasant  but overweight white female, in no distress.  VITAL SIGNS:  Weight is 184, blood pressure is 110/72, pulse 76 and  regular, respiratory rate 14, afebrile.  HEENT:  Unremarkable.  NECK:  Carotids are normal without bruit.  No lymphadenopathy,  thyromegaly, or JVP elevation.  No parvus et tardus.  LUNGS:  Clear, good diaphragmatic motion.  No wheezing.  HEART:  S1 and S2 with a soft systolic ejection murmur.  Second heart  sound is preserved.  There is no AI.  PMI normal.  ABDOMEN:  Benign.  Bowel sounds positive.  No AAA.  No tenderness.  No bruit.  No  hepatosplenomegaly or hepatojugular reflux.  No tenderness.  EXTREMITIES:  Distal pulses are intact.  No edema.  NEURO:  Nonfocal.  SKIN:  Warm and dry.  MUSCULOSKELETAL:  No muscular weakness.   EKG shows sinus rhythm with left axis deviation and poor R-wave  progression.   IMPRESSION:  1. Preoperative clearance.  The patient is at low risk.  She does not      have significant cardiopulmonary disease or symptoms preexisting.      She is cleared for surgery.  2. Murmur, likely aortic sclerosis.  Check 2-D echocardiogram.      Certainly, would not indicate significant aortic stenosis based on      exam.  3. Abnormal EKG, poor R-wave progression likely secondary to body      habitus and breast tissue.  A 2-D echocardiogram will assess left      ventricular function and rule out regional wall motion      abnormalities.   As indicated, the patient is cleared for surgery with Dr. Luisa Hart.  We  will do a 2-D echocardiogram for completeness sake.     Noralyn Pick. Eden Emms, MD, [facility]  Electronically Signed    PCN/MedQ  DD: 10/06/2007  DT: 10/07/2007  Job #: [id number]   cc:   Thomas A.  Cornett, M.D.  Georgina Quint. Plotnikov, MD

## 2010-06-26 NOTE — Assessment & Plan Note (Signed)
[facility]                            CARDIOLOGY OFFICE NOTE   NAME:[name], [name] BUCY                      MRN:          [id number]  DATE:03/23/2008                            DOB:          1940/02/26    [name] [name] returns today for followup.  I last saw her at the end of  last year to clear her for lumpectomy.  She had breast cancer.  She had  successful surgery and subsequently finished her radiation at the end of  December.   Ever since her radiation, she has been feeling funny.  She has had  myriad of atypical type complaints.  She has had some twitching in her  eyelids.  She has had some neck spasm.  She has had some atypical  muscular pain in her chest.  She complains of increasing palpitations  particularly at night.  She apparently has been having more frequent  PACs.  Indeed on the EKG today, she had frequent PACs.  Looking at the  morphology, there was a suggestion of some multifocal atrial  tachycardia.   The patient is overweight and has sleep apnea.  She has not seen Dr.  Shelle Iron in some time.  I suspect that part of her MAT or atrial  arrhythmia is related to her sleep apnea.  She claims that she wears her  CPAP mask on a regular basis.   She has not had any significant chest pain.  She has mild chronic  exertional dyspnea.  There has been no lower extremity edema.  Prior to  her surgery in September 2009, she had normal LV function with no  evidence of significant valvular heart disease.  She does have a benign  systolic ejection murmur and have this at the time of her echo.  There  is no previous history of coronary artery disease.  She had a  nonischemic Myoview in January 2008.   The patient's risk factors are fairly well modified.  She is on a statin  drug and takes an aspirin a day.   She is concerned about her palpitations.  She is also concerned about  having a stroke given her funny symptoms in her neck and eye.  I  assured  her that I do not think these current symptoms were related to a TIA.  However, she has rapid palpitations at night and given her frequent PACs  there, we need to rule out paroxysmal atrial fibrillation.  We will give  her an event monitor for 4-6 weeks.   Her palpitations are actually worse at night, particularly in  recumbency.  This makes me think that her sleep apnea is involved.   She tends to not have these many during the day and when she gets them,  there is nothing she can do to make them go away.  They can last  intermittently for minutes to hours.   She seems to be getting them on a more regular basis.   REVIEW OF SYSTEMS:  Otherwise negative.   Her current medications include simvastatin 40, meloxicam p.r.n.,  generic nitrofurantoin for frequent UTIs,  fexofenadine 180 a day.  She  has stopped her VESIcare, her metformin 500 b.i.d., iron, omega 3.   PHYSICAL EXAMINATION:  GENERAL:  Remarkable for an overweight female in  no distress.  She is in sinus rhythm with PACs.  VITAL SIGNS:  Her weight is up from 184-195, her blood pressure 129/71.  Overall, pulse is 85, respiratory rate 14, afebrile.  HEENT:  Unremarkable.  NECK:  Carotids normal without bruit.  No lymphadenopathy, thyromegaly,  or JVP elevation.  LUNGS:  Clear.  Good diaphragmatic motion.  No wheezing.  S1 and S2 with  a benign systolic ejection murmur.  PMI normal.  ABDOMEN:  Benign status post appendectomy and cholecystectomy.  No AAA.  No tenderness.  No bruit.  No hepatosplenomegaly or hepatojugular  reflux.  No tenderness.  EXTREMITIES:  Distal pulses are intact.  No edema.  NEUROLOGIC:  Nonfocal.  SKIN:  Warm and  dry.  MUSCULOSKELETAL:  No muscular weakness.   IMPRESSION:  1. Atrial arrhythmia, likely related to sleep apnea.  The patient will      get an event monitor to make sure she is not having paroxysmal      atrial fibrillation.  Continue aspirin therapy, may institute beta-       blocker or calcium channel blocker after reviewing event monitor.      I do not think there is any need to repeat a stress test or echo at      this time since they were done fairly recently and any effects from      radiation would usually take longer to show themselves.  2. Breast cancer.  Follow up with Oncology.  Currently, no need for      adjuvant chemotherapy, seems to be doing well.  3. Frequent urinary tract infections.  Continue with nitrofurantoin,      spot cultures every 3 months.  4. Diabetes.  Continue metformin, hemoglobin A1c quarterly.  5. Sleep apnea.  We will make her a followup appointment with Dr.      Shelle Iron and she continues to require CPAP.  Her symptoms are worse      at night and frequently atrial arrhythmias can be related to      chronic lung disease and sleep apnea.  I will see her back after      she has had her event monitor to make sure that her rhythm is truly      just an ectopic atrial rhythm, and we may institute either calcium      blockers or beta blockers in the future.  I think the main point      here is to rule out any paroxysmal atrial fibrillation particularly      given the patient's concern for strokes.     Noralyn Pick. Eden Emms, MD, [facility]  Electronically Signed    PCN/MedQ  DD: 03/23/2008  DT: 03/24/2008  Job #: [id number]

## 2010-06-27 ENCOUNTER — Encounter (INDEPENDENT_AMBULATORY_CARE_PROVIDER_SITE_OTHER): Payer: Self-pay | Admitting: Surgery

## 2010-06-29 NOTE — Op Note (Signed)
[name], [name] [name]               ACCOUNT NO.:  [id number]   MEDICAL RECORD NO.:  [id number]          PATIENT TYPE:  AMB   LOCATION:  DAY                          FACILITY:  [facility]   PHYSICIAN:  Sigmund I. Patsi Sears, M.D.DATE OF BIRTH:  29-Mar-1940   DATE OF PROCEDURE:  09/06/2005  DATE OF DISCHARGE:                                 OPERATIVE REPORT   PREOPERATIVE DIAGNOSIS:  Stress urinary incontinence.   POSTOPERATIVE DIAGNOSIS:  Stress urinary incontinence.   OPERATION:  Implantation of [employer] pubovaginal sling.   SURGEON:  Sigmund I. Patsi Sears, M.D.   ANESTHESIA:  General LMA.   PREOPERATIVE PREPARATION:  Appropriate preanesthesia, the patient was  brought to the operating room and placed on the operating room in the dorsal  supine position, where general LMA anesthesia was introduced.  She was then  replaced in the dorsal lithotomy position, where the pubis was prepped with  Betadine solution and draped in the usual fashion.   REVIEW OF SYSTEMS:  This is a 70 year old female, para 3-3-0, with mixed  stress and incontinence.  She takes Vesicare 10 mg 1 p.o. per day, with  improvement in her urge symptoms, but continues to have stress urinary  incontinence.  Urodynamics shows a Valsalva leak point pressure of 68 cmH2O  (at 200 cc fill).  The volume loss is moderate.  She is now for pubovaginal  sling.   PROCEDURE:  Vaginal inspection revealed grade 1 cystourethrocele.  No  enterocele or rectocele is present.   Foley catheter was placed, and the periurethral pubovaginal arch tissue was  infused with 5 cc of Marcaine 0.25 plain.  Following this, a 1.5 cm midline  mid-urethral incision was made.  Subcutaneous tissue was dissected  bilaterally, with care taken to avoid injury to the urethra.  Dissection was  accomplished to the retropubic space.   Two separate incisions were then made with a 10 blade knife suprapubically,  1 fingerbreadth above the pubis, 1  fingerbreadth lateral to the midline.  Using the [facility] transvaginal sling system, a mesh was placed in standard  retrograde fashion.  With the [place] needles in place, cystoscopy was  accomplished, which shows no evidence of any needle within the bladder.   The sling was attached, brought retrograde, with a right-angle clamp behind  it for tensioning.  Following correct tensioning, the sleeves were removed,  and the sling was cut subcutaneously in standard fashion.   The vaginal wound was then closed with running 3-0 Vicryl suture, and the  suprapubic incisions were closed with Dermabond.  The patient was awakened  and taken to the recovery room in good condition.  She received IV Toradol  in the operating room, as well as a B & O suppository.  Note that vaginal  packing was placed at the end of the procedure.      Sigmund I. Patsi Sears, M.D.  Electronically Signed     SIT/MEDQ  D:  09/06/2005  T:  09/06/2005  Job:  [id number]

## 2010-06-29 NOTE — Procedures (Signed)
[name], [name] NO.:  [id number]   MEDICAL RECORD NO.:  [id number]          PATIENT TYPE:  OUT   LOCATION:  SLEEP CENTER                 FACILITY:  [facility]   PHYSICIAN:  Marcelyn Bruins, M.D. [facility] DATE OF BIRTH:  12/17/1940   DATE OF STUDY:  09/04/2004                              NOCTURNAL POLYSOMNOGRAM   REFERRING PHYSICIAN:  Dr. Oliver Barre   DATE OF STUDY:  September 04, 2004   INDICATION FOR THE STUDY:  Hypersomnia with sleep apnea. Epworth score: 15.   SLEEP ARCHITECTURE:  The patient had a total sleep time of 303 minutes with  adequate slow wave sleep but very decreased REM. Sleep onset latency was  normal at 13 minutes and REM onset was very prolonged at 192 minutes. Sleep  efficiency was 78%.   IMPRESSION:  1.  Moderate obstructive sleep apnea/hypopnea syndrome with a respiratory      disturbance index of 26 events per hour and oxygen desaturation as low      as 74%. The events were not positional but clearly worse during REM.      Treatment of this degree of sleep apnea may include weight loss, upper      airway surgery, oral appliance, or C-PAP.  2.  Moderate snoring noted throughout the study  3.  Occasional premature ventricular contraction noted.  4.  Moderate numbers of leg jerks with significant sleep disruption.      Clinical correlation is suggested if the patient fails to respond      appropriately to treatment of her obstructive sleep apnea.     ______________________________  Suzzette Righter    KC/MEDQ  D:  09/19/2004 11:38:30  T:  09/19/2004 13:26:14  Job:  [id number]

## 2010-06-29 NOTE — Assessment & Plan Note (Signed)
[facility]                           PRIMARY CARE OFFICE NOTE   NAME:Sircy, [name] [name]                      MRN:          [id number]  DATE:06/13/2006                            DOB:          [birth year]    PROCEDURE:  Cryosurgery.   INDICATIONS:  Actinic keratosis, left upper anterior chest.  The risk,  including incomplete procedure and recurrence were discussed with the  patient in detail.  She agreed to the procedure.   DESCRIPTION OF PROCEDURE:  The patient was treated with liquid nitrogen  in the usual fashion.  Tolerated well.  Complications none.  If this does not resolve it, we will remove with a  shaved biopsy.     Georgina Quint. Plotnikov, MD  Electronically Signed    AVP/MedQ  DD: 06/16/2006  DT: 06/17/2006  Job #: [id number]

## 2010-08-19 ENCOUNTER — Other Ambulatory Visit: Payer: Self-pay | Admitting: Internal Medicine

## 2010-08-20 ENCOUNTER — Other Ambulatory Visit: Payer: Self-pay | Admitting: *Deleted

## 2010-08-20 MED ORDER — SOLIFENACIN SUCCINATE 10 MG PO TABS: 10.0000 mg | ORAL_TABLET | Freq: Every day | ORAL | Status: DC

## 2010-08-20 MED ORDER — SIMVASTATIN 40 MG PO TABS: 40.0000 mg | ORAL_TABLET | Freq: Every day | ORAL | Status: AC

## 2010-08-21 ENCOUNTER — Telehealth: Payer: Self-pay | Admitting: *Deleted

## 2010-08-21 NOTE — Telephone Encounter (Signed)
See messages below.  

## 2010-08-21 NOTE — Telephone Encounter (Signed)
Message copied by Merrilyn Puma on Tue Aug 21, 2010  4:44 PM ------      Message from: COUSIN, SHARON T      Created: Tue Aug 21, 2010  9:44 AM       Dejanique Ruehl,--per pt she will no longer be coming to this practice because she moved to [employer].--You are welcome      ----- Message -----         From: Lanier Prude, CMA         Sent: 08/20/2010   4:51 PM           To: Epimenio Sarin, #            Please sched OV with PCP. Last OV was 01/2010 and he wanted her back in 3 mo.            Thanks!!

## 2010-08-28 ENCOUNTER — Encounter (INDEPENDENT_AMBULATORY_CARE_PROVIDER_SITE_OTHER): Payer: Self-pay | Admitting: Surgery

## 2010-08-28 ENCOUNTER — Ambulatory Visit (INDEPENDENT_AMBULATORY_CARE_PROVIDER_SITE_OTHER): Payer: Medicare Other | Admitting: Surgery

## 2010-08-28 DIAGNOSIS — Z853 Personal history of malignant neoplasm of breast: Secondary | ICD-10-CM

## 2010-08-28 NOTE — Progress Notes (Signed)
Subjective:     Patient ID: [name] [name], female   DOB: [birth year], 71 y.o.   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 Breast pain in her right breast is improved. She is now retired and taking care of her grandchildren. She has no new complaints. She denies any new breast mass, nipple drainage, or change in the appearance of either breast.   Review of Systems  Constitutional: Negative.   HENT: Negative.   Eyes: Negative.   Respiratory: Negative.   Gastrointestinal: Negative.        Objective:   Physical Exam  Constitutional: She appears well-developed and well-nourished.  HENT:  Head: Normocephalic and atraumatic.  Eyes: EOM are normal. Pupils are equal, round, and reactive to light.  Pulmonary/Chest: Effort normal and breath sounds normal.       Right lumpectomy site is well healed. Minimal cosmetic defect to  the right breast. No mass in the right breast. Right axilla is normal left breast and left axilla were normal  Skin: Skin is dry.  Psychiatric: She has a normal mood and affect. Her behavior is normal. Thought content normal.       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 She is due for her mammogram next month. She continued to follow up with Dr. Donnie Coffin.

## 2010-08-28 NOTE — Patient Instructions (Signed)
You will return in 1 year.  Mammogram for next month.

## 2010-10-03 ENCOUNTER — Ambulatory Visit
Admission: RE | Admit: 2010-10-03 | Discharge: 2010-10-03 | Disposition: A | Discharge status: Home / Self Care | Payer: Medicare Other | Source: Ambulatory Visit | Attending: Oncology | Admitting: Oncology

## 2010-10-03 DIAGNOSIS — Z9889 Other specified postprocedural states: Secondary | ICD-10-CM

## 2010-11-12 LAB — GLUCOSE, CAPILLARY: Glucose-Capillary: 94

## 2010-11-14 LAB — CBC
HCT: 34.3 — ABNORMAL LOW
MCHC: 34.7
MCV: 94.1
Platelets: 263
RDW: 13.6

## 2010-11-14 LAB — DIFFERENTIAL
Basophils Absolute: 0
Eosinophils Absolute: 0.1
Eosinophils Relative: 1
Monocytes Absolute: 0.4

## 2010-11-14 LAB — BASIC METABOLIC PANEL
BUN: 18
CO2: 25
Chloride: 105
Glucose, Bld: 126 — ABNORMAL HIGH
Potassium: 3.9

## 2011-01-14 ENCOUNTER — Telehealth: Payer: Self-pay | Admitting: *Deleted

## 2011-01-14 NOTE — Telephone Encounter (Signed)
gave patient appointment for lab on 01-18-2011 at 10:00am

## 2011-01-15 ENCOUNTER — Encounter: Payer: Medicare Other | Admitting: Obstetrics and Gynecology

## 2011-01-18 ENCOUNTER — Other Ambulatory Visit (HOSPITAL_BASED_OUTPATIENT_CLINIC_OR_DEPARTMENT_OTHER): Payer: Medicare Other | Admitting: Lab

## 2011-01-18 ENCOUNTER — Other Ambulatory Visit: Payer: Self-pay | Admitting: Oncology

## 2011-01-18 DIAGNOSIS — C50419 Malignant neoplasm of upper-outer quadrant of unspecified female breast: Secondary | ICD-10-CM

## 2011-01-18 DIAGNOSIS — Z171 Estrogen receptor negative status [ER-]: Secondary | ICD-10-CM

## 2011-01-18 LAB — CBC WITH DIFFERENTIAL/PLATELET
BASO%: 0.5 % (ref 0.0–2.0)
EOS%: 1.7 % (ref 0.0–7.0)
LYMPH%: 29.3 % (ref 14.0–49.7)
MCH: 30.5 pg (ref 25.1–34.0)
MCHC: 33.7 g/dL (ref 31.5–36.0)
MONO#: 0.3 10*3/uL (ref 0.1–0.9)
RBC: 4.22 10*6/uL (ref 3.70–5.45)
WBC: 4.7 10*3/uL (ref 3.9–10.3)
lymph#: 1.4 10*3/uL (ref 0.9–3.3)

## 2011-01-18 LAB — COMPREHENSIVE METABOLIC PANEL
ALT: 24 U/L (ref 0–35)
AST: 27 U/L (ref 0–37)
CO2: 30 mEq/L (ref 19–32)
Sodium: 138 mEq/L (ref 135–145)
Total Bilirubin: 0.6 mg/dL (ref 0.3–1.2)
Total Protein: 6.5 g/dL (ref 6.0–8.3)

## 2011-01-25 ENCOUNTER — Ambulatory Visit (HOSPITAL_BASED_OUTPATIENT_CLINIC_OR_DEPARTMENT_OTHER): Payer: Medicare Other | Admitting: Oncology

## 2011-01-25 ENCOUNTER — Encounter: Payer: Self-pay | Admitting: Oncology

## 2011-01-25 VITALS — BP 133/80 | HR 83 | Temp 97.8°F | Ht 63.0 in | Wt 199.3 lb

## 2011-01-25 DIAGNOSIS — E559 Vitamin D deficiency, unspecified: Secondary | ICD-10-CM

## 2011-01-25 DIAGNOSIS — Z87898 Personal history of other specified conditions: Secondary | ICD-10-CM

## 2011-01-25 DIAGNOSIS — Z853 Personal history of malignant neoplasm of breast: Secondary | ICD-10-CM

## 2011-01-25 NOTE — Progress Notes (Signed)
Hematology and Oncology Follow Up Visit  [name] [name] [id number] [birth year] 70 y.o. 01/25/2011 1:17 PM PCP Dr Willette Cluster ([place])  Principle Diagnosis: Hx of DCIS , s/p lumpectomy 10/26/05, s/p xrt completed 12/08, er/pr-, randomized on b43 study.  Interim History:  There have been no intercurrent illness, hospitalizations or medication changes.She is living in [place] looking after her 3 grandchildren. Medications: I have reviewed the patient's current medications.  Allergies:  Allergies  Allergen Reactions  . Morphine Sulfate Itching  . Sulfonamide Derivatives Other (See Comments)    Lymph nodes swelled    Past Medical History, Surgical history, Social history, and Family History were reviewed and updated.  Review of Systems: Constitutional:  Negative for fever, chills, night sweats, anorexia, weight loss, pain. Cardiovascular: no chest pain or dyspnea on exertion Respiratory: no cough, shortness of breath, or wheezing Neurological: no TIA or stroke symptoms Dermatological: negative ENT: negative Skin Gastrointestinal: no abdominal pain, change in bowel habits, or black or bloody stools Genito-Urinary: no dysuria, trouble voiding, or hematuria Hematological and Lymphatic: negative Breast: negative Musculoskeletal: negative Remaining ROS negative.  Physical Exam: Blood pressure 133/80, pulse 83, temperature 97.8 F (36.6 C), height 5\' 3"  (1.6 m), weight 199 lb 4.8 oz (90.402 kg). ECOG: 0 General appearance: alert, cooperative and appears stated age Head: Normocephalic, without obvious abnormality, atraumatic Neck: no adenopathy, no carotid bruit, no JVD, supple, symmetrical, trachea midline and thyroid not enlarged, symmetric, no tenderness/mass/nodules Lymph nodes: Cervical, supraclavicular, and axillary nodes normal. Cardiac : regular rate and rhythm, no murmurs or gallops Pulmonary:clear to auscultation bilaterally and normal percussion  bilaterally Breasts: inspection negative, no nipple discharge or bleeding, no masses or nodularity palpable Abdomen:soft, non-tender; bowel sounds normal; no masses,  no organomegaly Extremities negative Neuro: alert, oriented, normal speech, no focal findings or movement disorder noted  Lab Results: Lab Results  Component Value Date   WBC 4.7 01/18/2011   HGB 12.9 01/18/2011   HCT 38.3 01/18/2011   MCV 90.6 01/18/2011   PLT 241 01/18/2011     Chemistry      Component Value Date/Time   NA 138 01/18/2011 1010   K 4.7 01/18/2011 1010   CL 101 01/18/2011 1010   CO2 30 01/18/2011 1010   BUN 20 01/18/2011 1010   CREATININE 0.88 01/18/2011 1010      Component Value Date/Time   CALCIUM 10.0 01/18/2011 1010   CALCIUM 8.9 12/03/2007 2139   ALKPHOS 67 01/18/2011 1010   AST 27 01/18/2011 1010   ALT 24 01/18/2011 1010   BILITOT 0.6 01/18/2011 1010      .pathology. Radiological Studies: chest X-ray n/a Mammogram F/u 2/13 Bone density n/a  Impression and Plan: Hx of DCIS rt breast s/p lumpectomy, enrollment B43 study, randomized to no herceptin. No current complaints, she will be seen again in 1 yr .  More than 50% of the visit was spent in patient-related counselling   Pierce Crane, MD 12/14/20121:17 PM

## 2011-01-26 ENCOUNTER — Telehealth: Payer: Self-pay | Admitting: Oncology

## 2011-01-26 NOTE — Telephone Encounter (Signed)
mailed pts dec 2013 appts to her home on 01/26/2011

## 2011-02-01 ENCOUNTER — Encounter: Payer: Self-pay | Admitting: Gynecology

## 2011-02-01 DIAGNOSIS — N951 Menopausal and female climacteric states: Secondary | ICD-10-CM | POA: Insufficient documentation

## 2011-02-01 DIAGNOSIS — C801 Malignant (primary) neoplasm, unspecified: Secondary | ICD-10-CM | POA: Insufficient documentation

## 2011-02-01 DIAGNOSIS — E7800 Pure hypercholesterolemia, unspecified: Secondary | ICD-10-CM | POA: Insufficient documentation

## 2011-02-13 ENCOUNTER — Encounter: Payer: Self-pay | Admitting: Obstetrics and Gynecology

## 2011-02-13 ENCOUNTER — Ambulatory Visit (INDEPENDENT_AMBULATORY_CARE_PROVIDER_SITE_OTHER): Payer: Medicare Other | Admitting: Obstetrics and Gynecology

## 2011-02-13 ENCOUNTER — Other Ambulatory Visit (HOSPITAL_COMMUNITY)
Admission: RE | Admit: 2011-02-13 | Discharge: 2011-02-13 | Disposition: A | Discharge status: Home / Self Care | Payer: Medicare Other | Source: Ambulatory Visit | Attending: Obstetrics and Gynecology | Admitting: Obstetrics and Gynecology

## 2011-02-13 VITALS — BP 122/78 | Ht 62.0 in | Wt 199.0 lb

## 2011-02-13 DIAGNOSIS — N952 Postmenopausal atrophic vaginitis: Secondary | ICD-10-CM

## 2011-02-13 DIAGNOSIS — Z01419 Encounter for gynecological examination (general) (routine) without abnormal findings: Secondary | ICD-10-CM | POA: Insufficient documentation

## 2011-02-13 DIAGNOSIS — N816 Rectocele: Secondary | ICD-10-CM

## 2011-02-13 DIAGNOSIS — C50919 Malignant neoplasm of unspecified site of unspecified female breast: Secondary | ICD-10-CM

## 2011-02-13 DIAGNOSIS — N63 Unspecified lump in unspecified breast: Secondary | ICD-10-CM

## 2011-02-13 DIAGNOSIS — Z124 Encounter for screening for malignant neoplasm of cervix: Secondary | ICD-10-CM

## 2011-02-13 DIAGNOSIS — M810 Age-related osteoporosis without current pathological fracture: Secondary | ICD-10-CM

## 2011-02-13 DIAGNOSIS — R3915 Urgency of urination: Secondary | ICD-10-CM

## 2011-02-13 MED ORDER — OXYBUTYNIN CHLORIDE ER 5 MG PO TB24: 5.0000 mg | ORAL_TABLET | Freq: Every day | ORAL | Status: AC

## 2011-02-13 NOTE — Progress Notes (Signed)
Subjective:     Patient ID: [name] [name], female   DOB: 1940-08-11, 71 y.o.   MRN: [id number]  HPIpatient came to see me today for further follow up. We are treating her for her osteoporosis based on a vertebral compression fracture with a bone density showing osteopenia. She has had 3 doses of Prolia. We picked injectable because of her bad GERD. She was wondering about oral medicine because of cost. We diagnosed her with a rectocele last year. She is constipated but she also has some trouble getting stool out. She is seeing no change in her bowel habits other than the above. She is having no rectal bleeding. She has atrophic vaginitis but is not sexually active and so was fine without treatment. 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 She would like to try generic Ditropan because of cost. She is having no vaginal bleeding. She is having no pelvic pain. She does have breast cancer. We found an additional lump in her right breast last year. It was biopsied by the surgeon and was a benign neuroma.   Review of Systems  Constitutional: Positive for appetite change.  HENT: Negative.        Vertigo  Eyes: Negative.   Respiratory: Negative.   Cardiovascular: Negative.   Gastrointestinal: Positive for constipation.       Heartburn  Genitourinary:       Incontinence  Musculoskeletal: Negative.   Skin: Negative.   Neurological: Negative.   Hematological: Negative.   Psychiatric/Behavioral: Negative.        Objective:   Physical ExamPhysical examination:  Kennon Portela present HEENT within normal limits. Neck: Thyroid not large. No masses. Supraclavicular nodes: not enlarged. Breasts: Examined in both sitting midline position. No skin changes and no masses. Abdomen: Soft no guarding rebound or masses or hernia. Pelvic: External: Within normal limits. BUS: Within normal limits. Vaginal:within normal limits. Poor  estrogen effect. No evidence of cystocele  or enterocele. Patient has a first degree rectocele. Cervix: clean. Uterus: Normal size and shape. Adnexa: No masses. Rectovaginal exam: Confirmatory and negative. Extremities: Within normal limits.     Assessment:    #1. Breast cancer #2. Detrussor instability #3. Atrophic vaginitis #4. Benign breast lump-neuroma right breast #5. Osteoporosis    Plan:     Continue yearly mammograms. Switched her to ditropan XL 5mg m. Discussed Fosamax and how she needs to take it. For the moment she will continue Prolia. Continue to watch her rectocele.

## 2011-03-04 ENCOUNTER — Encounter: Payer: Self-pay | Admitting: *Deleted

## 2011-03-04 ENCOUNTER — Other Ambulatory Visit (INDEPENDENT_AMBULATORY_CARE_PROVIDER_SITE_OTHER): Payer: Self-pay | Admitting: Surgery

## 2011-03-04 ENCOUNTER — Other Ambulatory Visit: Payer: Self-pay | Admitting: Oncology

## 2011-03-04 DIAGNOSIS — R921 Mammographic calcification found on diagnostic imaging of breast: Secondary | ICD-10-CM

## 2011-03-04 NOTE — Progress Notes (Signed)
Patient ID: [name] [name], female   DOB: Aug 06, 1940, 71 y.o.   MRN: [id number] Pt called wanting pap results, results given to pt.

## 2011-04-05 ENCOUNTER — Ambulatory Visit
Admission: RE | Admit: 2011-04-05 | Discharge: 2011-04-05 | Disposition: A | Discharge status: Home / Self Care | Payer: Medicare Other | Source: Ambulatory Visit | Attending: Surgery | Admitting: Surgery

## 2011-04-05 DIAGNOSIS — R921 Mammographic calcification found on diagnostic imaging of breast: Secondary | ICD-10-CM

## 2011-08-30 ENCOUNTER — Other Ambulatory Visit: Payer: Self-pay | Admitting: Oncology

## 2011-08-30 DIAGNOSIS — Z9889 Other specified postprocedural states: Secondary | ICD-10-CM

## 2011-08-30 DIAGNOSIS — Z853 Personal history of malignant neoplasm of breast: Secondary | ICD-10-CM

## 2011-10-28 ENCOUNTER — Ambulatory Visit
Admission: RE | Admit: 2011-10-28 | Discharge: 2011-10-28 | Disposition: A | Discharge status: Home / Self Care | Payer: Medicare Other | Source: Ambulatory Visit | Attending: Oncology | Admitting: Oncology

## 2011-10-28 DIAGNOSIS — Z9889 Other specified postprocedural states: Secondary | ICD-10-CM

## 2011-10-28 DIAGNOSIS — Z853 Personal history of malignant neoplasm of breast: Secondary | ICD-10-CM

## 2011-12-09 ENCOUNTER — Encounter: Payer: Self-pay | Admitting: Internal Medicine

## 2011-12-09 ENCOUNTER — Ambulatory Visit (INDEPENDENT_AMBULATORY_CARE_PROVIDER_SITE_OTHER): Payer: Medicare Other | Admitting: Internal Medicine

## 2011-12-09 VITALS — BP 122/70 | HR 68 | Ht 62.5 in | Wt 191.2 lb

## 2011-12-09 DIAGNOSIS — K219 Gastro-esophageal reflux disease without esophagitis: Secondary | ICD-10-CM

## 2011-12-09 MED ORDER — DEXLANSOPRAZOLE 60 MG PO CPDR: 60.0000 mg | DELAYED_RELEASE_CAPSULE | Freq: Every day | ORAL | Status: DC

## 2011-12-09 NOTE — Progress Notes (Signed)
HISTORY OF PRESENT ILLNESS:  [name] [name] is a 71 y.o. female with diabetes mellitus, hyperlipidemia, obesity, sleep apnea, low back pain, and GERD. She presents today regarding worsening GERD symptoms over the past 10-12 months. She was last evaluated in the office September 2011 regarding epigastric discomfort/chest discomfort and globus type sensation. See that dictation. She was prescribed pantoprazole 40 mg daily and scheduled for upper endoscopy, which was performed 12/13/2009. Examination was normal except for a small hiatal hernia. Previous upper endoscopy in 2009 revealed mild esophagitis, but was otherwise normal. There is a family history of esophageal cancer in her father. Previous colonoscopy in July of 2000 and again in August of 2006 were unremarkable. Routine followup in 10 years planned. She states that she did well until about 10 or 12 months ago. She began to notice increased regurgitation, pyrosis, and globus type sensation as well as bubbling type sensation in the pharyngeal region. Possibly some mild dysphagia with pills. Nothing overwhelming. She has been using times for breakthrough symptoms. As well, Zantac about once per week. Stricture PPI in the morning prior mills, but not necessarily 30-60 minutes prior. She has been working on weight reduction reports 10 pound weight loss in the past 6 months. Moderate compliance with reflux precautions. Over the past 4 weeks, she has increased her pantoprazole to 40 mg twice a day. On this regimen, improvement in burning, though incomplete. Still some regurgitation. She does have some concern over the family history of esophageal cancer.  REVIEW OF SYSTEMS:  All non-GI ROS negative except for  Past Medical History  Diagnosis Date  . Diabetes mellitus   . Cancer     DCIS-right breast  . Elevated cholesterol   . Osteoporosis   . Menopausal symptoms   . GERD (gastroesophageal reflux disease)   . Hiatal hernia     Past Surgical  History  Procedure Date  . Cholecystectomy 1974  . Incontinence surgery 2008  . Breast lumpectomy 10/2007  . Appendectomy     Social History LUVENA WENTLING  reports that she has quit smoking. She has never used smokeless tobacco. She reports that she drinks alcohol. She reports that she does not use illicit drugs.  family history includes Breast cancer in her mother; Cancer in her father and mother; Diabetes in her father, maternal grandfather, and mother; and Heart disease in her maternal grandmother and mother.  Allergies  Allergen Reactions  . Morphine Sulfate Itching  . Sulfonamide Derivatives Other (See Comments)    Lymph nodes swelled       PHYSICAL EXAMINATION: Vital signs: BP 122/70  Pulse 68  Ht 5' 2.5" (1.588 m)  Wt 191 lb 3.2 oz (86.728 kg)  BMI 34.41 kg/m2 General: Well-developed, well-nourished, no acute distress HEENT: Sclerae are anicteric, conjunctiva pink. Oral mucosa intact Lungs: Clear Heart: Regular Abdomen: soft, nontender, nondistended, no obvious ascites, no peritoneal signs, normal bowel sounds. No organomegaly. Extremities: No edema Psychiatric: alert and oriented x3. Cooperative     ASSESSMENT:  #1. GERD. Breakthrough symptoms on twice a day pantoprazole. #2. Prior endoscopy in 2009 and again in 2011 as described. No Barrett's #3. Colon cancer screening. Negative examinations in 2000 and again in 2006. No lower GI complaints. Routine followup around 2016   PLAN:  #1. Strict adherence to reflux precautions. Literature provided for review #2. Samples (30 days) of Dexilant 60 mg daily provided. She should take this in the morning 30 minutes before her first meal. She continues pantoprazole 30 minutes before  the evening meal or bedtime #3. Screening colonoscopy 2016 #4. Patient will contact us in 4 weeks regarding her progress. That time, she is better she may go back to twice a day pantoprazole. If Dexilant works, we could provide  prescription.Marland Kitchen

## 2011-12-09 NOTE — Patient Instructions (Addendum)
We have given you some samples of Dexilant to try along with some information on Gerd  Please follow up as needed

## 2011-12-31 ENCOUNTER — Ambulatory Visit (INDEPENDENT_AMBULATORY_CARE_PROVIDER_SITE_OTHER): Payer: Medicare Other | Admitting: Obstetrics and Gynecology

## 2011-12-31 DIAGNOSIS — M899 Disorder of bone, unspecified: Secondary | ICD-10-CM

## 2011-12-31 DIAGNOSIS — M8580 Other specified disorders of bone density and structure, unspecified site: Secondary | ICD-10-CM

## 2011-12-31 DIAGNOSIS — C50919 Malignant neoplasm of unspecified site of unspecified female breast: Secondary | ICD-10-CM

## 2011-12-31 NOTE — Patient Instructions (Addendum)
Continue yearly mammograms. Bone density with  next mammogram. 

## 2011-12-31 NOTE — Progress Notes (Signed)
Patient came to see me today to discuss her gynecological issues. She is stable with her breast cancer and is not due for her next mammogram until  next summer. She continues to see Dr. Caron Presume. She does not require any treatment at the moment. Her last bone density was 2009. She had mild osteopenia. She has slight loss of bone in her left hip since  2003 but stable since 2007. She takes calcium and vitamin D. She has had no fractures. She does well without hormone replacement therapy. She is now having vaginal bleeding or pelvic pain. She has always had normal Pap smears. Her last Pap smear was 2012.  We had a long discussion about on going GYN care. She now lives in [place] and with my retirement we'll probably continue GYN care there. I gave her some names. I told her she should have yearly GYN exams. I told her she no longer needed Pap smears. I asked her to get a bone density with her next mammogram. She will call with any new problems. She will get her next GYN exam in January, 2014.

## 2012-01-10 ENCOUNTER — Encounter: Payer: Self-pay | Admitting: Obstetrics and Gynecology

## 2012-01-21 ENCOUNTER — Other Ambulatory Visit: Payer: Medicare Other | Admitting: Lab

## 2012-01-23 ENCOUNTER — Telehealth: Payer: Self-pay | Admitting: *Deleted

## 2012-01-23 NOTE — Telephone Encounter (Signed)
Patient confirmed over the phone the new date and time on 01-29-2012 starting at 9:30am

## 2012-01-28 ENCOUNTER — Ambulatory Visit: Payer: Medicare Other | Admitting: Oncology

## 2012-01-28 ENCOUNTER — Other Ambulatory Visit: Payer: Medicare Other | Admitting: Lab

## 2012-01-29 ENCOUNTER — Telehealth: Payer: Self-pay | Admitting: *Deleted

## 2012-01-29 ENCOUNTER — Ambulatory Visit (HOSPITAL_BASED_OUTPATIENT_CLINIC_OR_DEPARTMENT_OTHER): Payer: Medicare Other | Admitting: Oncology

## 2012-01-29 ENCOUNTER — Other Ambulatory Visit (HOSPITAL_BASED_OUTPATIENT_CLINIC_OR_DEPARTMENT_OTHER): Payer: Medicare Other | Admitting: Lab

## 2012-01-29 VITALS — BP 127/81 | HR 58 | Temp 97.6°F | Resp 20 | Ht 62.5 in | Wt 189.0 lb

## 2012-01-29 DIAGNOSIS — E559 Vitamin D deficiency, unspecified: Secondary | ICD-10-CM

## 2012-01-29 DIAGNOSIS — Z853 Personal history of malignant neoplasm of breast: Secondary | ICD-10-CM

## 2012-01-29 DIAGNOSIS — D0510 Intraductal carcinoma in situ of unspecified breast: Secondary | ICD-10-CM

## 2012-01-29 LAB — CBC WITH DIFFERENTIAL/PLATELET
BASO%: 0.8 % (ref 0.0–2.0)
EOS%: 2.7 % (ref 0.0–7.0)
HCT: 36.3 % (ref 34.8–46.6)
LYMPH%: 26.9 % (ref 14.0–49.7)
MCH: 30.3 pg (ref 25.1–34.0)
MCHC: 33.8 g/dL (ref 31.5–36.0)
MCV: 89.6 fL (ref 79.5–101.0)
MONO#: 0.4 10*3/uL (ref 0.1–0.9)
MONO%: 6.6 % (ref 0.0–14.0)
NEUT%: 63 % (ref 38.4–76.8)
Platelets: 228 10*3/uL (ref 145–400)
RBC: 4.05 10*6/uL (ref 3.70–5.45)
WBC: 5.4 10*3/uL (ref 3.9–10.3)

## 2012-01-29 LAB — COMPREHENSIVE METABOLIC PANEL (CC13)
ALT: 18 U/L (ref 0–55)
AST: 23 U/L (ref 5–34)
Alkaline Phosphatase: 69 U/L (ref 40–150)
BUN: 17 mg/dL (ref 7.0–26.0)
Calcium: 9.4 mg/dL (ref 8.4–10.4)
Creatinine: 1 mg/dL (ref 0.6–1.1)
Total Bilirubin: 0.67 mg/dL (ref 0.20–1.20)

## 2012-01-29 LAB — VITAMIN D 25 HYDROXY (VIT D DEFICIENCY, FRACTURES): Vit D, 25-Hydroxy: 56 ng/mL (ref 30–89)

## 2012-01-29 NOTE — Progress Notes (Signed)
Hematology and Oncology Follow Up Visit  [name] [name] [id number] December 17, 1940 71 y.o. 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  Interim History:  There have been no intercurrent illness, hospitalizations or medication changes.She is living in [place] looking after her 3 grandchildren.she has had no intercurrent illness or hospitilzations.  Medications: I have reviewed the patient's current medications.  Allergies:  Allergies  Allergen Reactions  . Morphine Sulfate Itching  . Sulfonamide Derivatives Other (See Comments)    Lymph nodes swelled    Past Medical History, Surgical history, Social history, and Family History were reviewed and updated.  Review of Systems: Constitutional:  Negative for fever, chills, night sweats, anorexia, weight loss, pain. Cardiovascular: no chest pain or dyspnea on exertion Respiratory: no cough, shortness of breath, or wheezing Neurological: no TIA or stroke symptoms Dermatological: negative ENT: negative Skin Gastrointestinal: no abdominal pain, change in bowel habits, or black or bloody stools Genito-Urinary: no dysuria, trouble voiding, or hematuria Hematological and Lymphatic: negative Breast: negative Musculoskeletal: negative Remaining ROS negative.  Physical Exam: Blood pressure 127/81, pulse 58, temperature 97.6 F (36.4 C), resp. rate 20, height 5' 2.5" (1.588 m), weight 189 lb (85.73 kg). ECOG: 0 General appearance: alert, cooperative and appears stated age Head: Normocephalic, without obvious abnormality, atraumatic Neck: no adenopathy, no carotid bruit, no JVD, supple, symmetrical, trachea midline and thyroid not enlarged, symmetric, no tenderness/mass/nodules Lymph nodes: Cervical, supraclavicular, and axillary nodes normal. Cardiac : regular rate and rhythm, no murmurs or gallops Pulmonary:clear to  auscultation bilaterally and normal percussion bilaterally Breasts: inspection negative, no nipple discharge or bleeding, no masses or nodularity palpable Abdomen:soft, non-tender; bowel sounds normal; no masses,  no organomegaly Extremities negative Neuro: alert, oriented, normal speech, no focal findings or movement disorder noted  Lab Results: Lab Results  Component Value Date   WBC 5.4 01/29/2012   HGB 12.3 01/29/2012   HCT 36.3 01/29/2012   MCV 89.6 01/29/2012   PLT 228 01/29/2012     Chemistry      Component Value Date/Time   NA 138 01/18/2011 1010   K 4.7 01/18/2011 1010   CL 101 01/18/2011 1010   CO2 30 01/18/2011 1010   BUN 20 01/18/2011 1010   CREATININE 0.88 01/18/2011 1010      Component Value Date/Time   CALCIUM 10.0 01/18/2011 1010   CALCIUM 8.9 12/03/2007 2139   ALKPHOS 67 01/18/2011 1010   AST 27 01/18/2011 1010   ALT 24 01/18/2011 1010   BILITOT 0.6 01/18/2011 1010      .pathology. Radiological Studies: chest X-ray n/a Mammogram F/u  9/13- wnl Bone density n/a  Impression and Plan:  Hx of DCIS rt breast s/p lumpectomy, enrollment B43 study, randomized to no herceptin. No current complaints, she will be seen again in 1 yr .  More than 50% of the visit was spent in patient-related counselling   Pierce Crane, MD 12/18/201310:20 AM

## 2012-01-29 NOTE — Telephone Encounter (Signed)
Gave patient instruction about receiving her appointment for an year patient requested to see dr.magrinat

## 2012-08-28 ENCOUNTER — Ambulatory Visit (INDEPENDENT_AMBULATORY_CARE_PROVIDER_SITE_OTHER): Payer: Medicare Other | Admitting: Surgery

## 2012-08-28 ENCOUNTER — Encounter (INDEPENDENT_AMBULATORY_CARE_PROVIDER_SITE_OTHER): Payer: Self-pay | Admitting: Surgery

## 2012-08-28 VITALS — BP 140/90 | HR 100 | Temp 97.7°F | Resp 16 | Ht 62.0 in | Wt 190.8 lb

## 2012-08-28 DIAGNOSIS — Z853 Personal history of malignant neoplasm of breast: Secondary | ICD-10-CM

## 2012-08-28 NOTE — Patient Instructions (Signed)
No follow up necessary.  Will refer to PT for right arm/shoulder

## 2012-08-28 NOTE — Progress Notes (Signed)
Subjective:     Patient ID: [name] [name], female   DOB: July 29, 1940, 72 y.o.   MRN: [id number]  HPI The patient returns to clinic today are 6 month follow up of her right breast DCIS. Breast pain in her right breast is improved. She is now retired and taking care of her grandchildren. 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 She denies any new breast mass, nipple drainage, or change in the appearance of either breast.   Review of Systems  Constitutional: Negative.   HENT: Negative.   Eyes: Negative.   Respiratory: Negative.   Gastrointestinal: Negative.        Objective:   Physical Exam  Constitutional: She appears well-developed and well-nourished.  HENT:  Head: Normocephalic and atraumatic.  Eyes: EOM are normal. Pupils are equal, round, and reactive to light.  Pulmonary/Chest: Effort normal and breath sounds normal.       Right lumpectomy site is well healed. Minimal cosmetic defect to  the right breast. No mass in the right breast. Right axilla is normal left breast and left axilla were normal  No evidence of winged scapula. Patient does have weakness with abduction of the right shoulder and abduction. Grip strength appears normal. Skin: Skin is dry.  Psychiatric: She has a normal mood and affect. Her behavior is normal. Thought content normal.  Findings: Scattered fibroglandular densities. Postoperative scar  posterior right upper outer quadrant, stable, with no suspicious  associated findings. On the left, there are regional punctate  calcifications in the upper outer quadrant at the middle third  depth. These are stable.  Mammographic images were processed with CAD.  IMPRESSION:  Stable calcifications on the left, and stable benign postoperative  change on the right.  BI-RADS CATEGORY 2: Benign finding(s).  RECOMMENDATION:  Diagnostic bilateral mammogram in 1 year.      Assessment:     Right breast DCIS  status post lumpectomy and radiation therapy 2009    Plan:     She is 5 years out with no evidence of recurrent disease and can be released. Referred to physical therapy due to right shoulder weakness for evaluation and treatment.Marland Kitchen

## 2012-09-07 ENCOUNTER — Telehealth: Payer: Self-pay | Admitting: *Deleted

## 2012-09-07 NOTE — Telephone Encounter (Signed)
Left message for a return phone call to schedule her with another provider. Awaiting patient reponse.

## 2012-09-22 ENCOUNTER — Other Ambulatory Visit: Payer: Self-pay | Admitting: Family Medicine

## 2012-09-22 DIAGNOSIS — Z9889 Other specified postprocedural states: Secondary | ICD-10-CM

## 2012-09-22 DIAGNOSIS — Z853 Personal history of malignant neoplasm of breast: Secondary | ICD-10-CM

## 2012-10-06 ENCOUNTER — Encounter: Payer: Self-pay | Admitting: Internal Medicine

## 2012-10-29 ENCOUNTER — Other Ambulatory Visit: Payer: Self-pay | Admitting: Oncology

## 2012-10-29 DIAGNOSIS — Z853 Personal history of malignant neoplasm of breast: Secondary | ICD-10-CM

## 2012-10-30 ENCOUNTER — Ambulatory Visit
Admission: RE | Admit: 2012-10-30 | Discharge: 2012-10-30 | Disposition: A | Discharge status: Home / Self Care | Payer: Medicare Other | Source: Ambulatory Visit | Attending: Oncology | Admitting: Oncology

## 2012-10-30 ENCOUNTER — Other Ambulatory Visit: Payer: Self-pay | Admitting: Oncology

## 2012-10-30 DIAGNOSIS — Z853 Personal history of malignant neoplasm of breast: Secondary | ICD-10-CM

## 2013-03-15 ENCOUNTER — Telehealth: Payer: Self-pay | Admitting: Internal Medicine

## 2013-03-15 NOTE — Telephone Encounter (Signed)
Discussed with pt that the colon report and pathology reports should be in her records for the other GI office to see that she is due for her Colonoscopy. Pt aware.

## 2013-03-16 ENCOUNTER — Encounter: Payer: Self-pay | Admitting: Internal Medicine

## 2013-04-23 ENCOUNTER — Ambulatory Visit (AMBULATORY_SURGERY_CENTER): Payer: Self-pay | Admitting: *Deleted

## 2013-04-23 VITALS — Ht 62.0 in | Wt 199.4 lb

## 2013-04-23 DIAGNOSIS — Z1211 Encounter for screening for malignant neoplasm of colon: Secondary | ICD-10-CM

## 2013-04-23 MED ORDER — MOVIPREP 100 G PO SOLR: ORAL | Status: DC

## 2013-04-23 NOTE — Progress Notes (Signed)
No allergies to eggs or soy. No problems with anesthesia.  

## 2013-04-29 ENCOUNTER — Encounter: Payer: Self-pay | Admitting: Internal Medicine

## 2013-05-07 ENCOUNTER — Ambulatory Visit: Payer: Medicare Other | Admitting: Internal Medicine

## 2013-05-07 ENCOUNTER — Encounter: Payer: Self-pay | Admitting: Internal Medicine

## 2013-05-07 VITALS — BP 139/84 | HR 64 | Temp 97.3°F | Resp 18 | Ht 62.0 in | Wt 199.0 lb

## 2013-05-07 DIAGNOSIS — Z1211 Encounter for screening for malignant neoplasm of colon: Secondary | ICD-10-CM

## 2013-05-07 DIAGNOSIS — D126 Benign neoplasm of colon, unspecified: Secondary | ICD-10-CM

## 2013-05-07 LAB — GLUCOSE, CAPILLARY
Glucose-Capillary: 91 mg/dL (ref 70–99)
Glucose-Capillary: 73 mg/dL (ref 70–99)
Glucose-Capillary: 81 mg/dL (ref 70–99)

## 2013-05-07 MED ORDER — SODIUM CHLORIDE 0.9 % IV SOLN: 500.0000 mL | INTRAVENOUS | Status: DC

## 2013-05-07 NOTE — Progress Notes (Signed)
1055 Report to pacu rnm, vss bbs=clear Staff/ Mickle Mallory, informed  us space is ready.

## 2013-05-07 NOTE — Op Note (Signed)
[place]  [address] [place], [id number]   COLONOSCOPY PROCEDURE REPORT  PATIENT: [name] [name], [name] [name]  MR#: [id number] BIRTHDATE: 03/24/1940 , 72  yrs. old GENDER: Female ENDOSCOPIST: Eustace Quail, MD REFERRED PN:[id number] Recall, M.D. PROCEDURE DATE:  05/07/2013 PROCEDURE:   Colonoscopy with snare polypectomy x 1 First Screening Colonoscopy - Avg.  risk and is 50 yrs.  old or older - No.  Prior Negative Screening - Now for repeat screening. 10 or more years since last screening  History of Adenoma - Now for follow-up colonoscopy & has been > or = to 3 yrs.  N/A  Polyps Removed Today? Yes. ASA CLASS:   Class II INDICATIONS:average risk screening.   Negative prior screening exam 2000 and 2006 MEDICATIONS: MAC sedation, administered by CRNA and propofol (Diprivan) 250mg  IV  DESCRIPTION OF PROCEDURE:   After the risks benefits and alternatives of the procedure were thoroughly explained, informed consent was obtained.  A digital rectal exam revealed no abnormalities of the rectum.   The LB [id number]  endoscope was introduced through the anus and advanced to the cecum, which was identified by both the appendix and ileocecal valve. No adverse events experienced.   The quality of the prep was excellent, using MoviPrep  The instrument was then slowly withdrawn as the colon was fully examined.  COLON FINDINGS: A diminutive polyp was found in the transverse colon.  A polypectomy was performed with a cold snare.  The resection was complete and no meaningful polypoid tissue was available for analysis (too small).   The colon mucosa was otherwise normal.  Retroflexed views revealed internal hemorrhoids. The time to cecum=4 minutes 36 seconds.  Withdrawal time=9 minutes 49 seconds.  The scope was withdrawn and the procedure completed. COMPLICATIONS: There were no complications.  ENDOSCOPIC IMPRESSION: 1.   Diminutive polyp was found in the transverse colon;  polypectomy was performed with a cold snare 2.   The colon mucosa was otherwise normal  RECOMMENDATIONS: 1. Return to the care of your primary provider.  GI follow up as needed   eSigned:  Eustace Quail, MD 05/07/2013 10:50 AM   cc: The Patient   ; Byrd Hesselbach Bankaitis,MD

## 2013-05-07 NOTE — Progress Notes (Signed)
Called to room to assist during endoscopic procedure.  Patient ID and intended procedure confirmed with present staff. Received instructions for my participation in the procedure from the performing physician.  

## 2013-05-07 NOTE — Patient Instructions (Signed)
Polyp information sheet given.  YOU HAD AN ENDOSCOPIC PROCEDURE TODAY AT [facility]: Refer to the procedure report that was given to you for any specific questions about what was found during the examination.  If the procedure report does not answer your questions, please call your gastroenterologist to clarify.  If you requested that your care partner not be given the details of your procedure findings, then the procedure report has been included in a sealed envelope for you to review at your convenience later.  YOU SHOULD EXPECT: Some feelings of bloating in the abdomen. Passage of more gas than usual.  Walking can help get rid of the air that was put into your GI tract during the procedure and reduce the bloating. If you had a lower endoscopy (such as a colonoscopy or flexible sigmoidoscopy) you may notice spotting of blood in your stool or on the toilet paper. If you underwent a bowel prep for your procedure, then you may not have a normal bowel movement for a few days.  DIET: Your first meal following the procedure should be a light meal and then it is ok to progress to your normal diet.  A half-sandwich or bowl of soup is an example of a good first meal.  Heavy or fried foods are harder to digest and may make you feel nauseous or bloated.  Likewise meals heavy in dairy and vegetables can cause extra gas to form and this can also increase the bloating.  Drink plenty of fluids but you should avoid alcoholic beverages for 24 hours.  ACTIVITY: Your care partner should take you home directly after the procedure.  You should plan to take it easy, moving slowly for the rest of the day.  You can resume normal activity the day after the procedure however you should NOT DRIVE or use heavy machinery for 24 hours (because of the sedation medicines used during the test).    SYMPTOMS TO REPORT IMMEDIATELY: A gastroenterologist can be reached at any hour.  During normal business hours, 8:30 AM to  5:00 PM Monday through Friday, call [phone].  After hours and on weekends, please call the GI answering service at [phone] who will take a message and have the physician on call contact you.   Following lower endoscopy (colonoscopy or flexible sigmoidoscopy):  Excessive amounts of blood in the stool  Significant tenderness or worsening of abdominal pains  Swelling of the abdomen that is new, acute  Fever of 100F or higher  FOLLOW UP: If any biopsies were taken you will be contacted by phone or by letter within the next 1-3 weeks.  Call your gastroenterologist if you have not heard about the biopsies in 3 weeks.  Our staff will call the home number listed on your records the next business day following your procedure to check on you and address any questions or concerns that you may have at that time regarding the information given to you following your procedure. This is a courtesy call and so if there is no answer at the home number and we have not heard from you through the emergency physician on call, we will assume that you have returned to your regular daily activities without incident.  SIGNATURES/CONFIDENTIALITY: You and/or your care partner have signed paperwork which will be entered into your electronic medical record.  These signatures attest to the fact that that the information above on your After Visit Summary has been reviewed and is understood.  Full responsibility  of the confidentiality of this discharge information lies with you and/or your care-partner.

## 2013-05-07 NOTE — Progress Notes (Signed)
Report to pacu rn, vss, bbs=clear 

## 2013-05-07 NOTE — Progress Notes (Signed)
Recovery started at 1040 in procedure room 2, vss, bbs=clear Bed space in recovery not ready.

## 2013-05-10 ENCOUNTER — Telehealth: Payer: Self-pay

## 2013-05-10 NOTE — Telephone Encounter (Signed)
  Follow up Call-  Call back number 05/07/2013  Post procedure Call Back phone  # [phone]  Permission to leave phone message Yes     Patient questions:  Do you have a fever, pain , or abdominal swelling? no Pain Score  0 *  Have you tolerated food without any problems? yes  Have you been able to return to your normal activities? yes  Do you have any questions about your discharge instructions: Diet   no Medications  no Follow up visit  no  Do you have questions or concerns about your Care? no  Actions: * If pain score is 4 or above: No action needed, pain <4.

## 2013-12-13 ENCOUNTER — Encounter: Payer: Self-pay | Admitting: Internal Medicine

## 2014-09-16 ENCOUNTER — Encounter: Payer: Self-pay | Admitting: Internal Medicine

## 2014-09-16 ENCOUNTER — Encounter: Payer: Self-pay | Admitting: Gastroenterology

## 2015-06-20 ENCOUNTER — Telehealth: Payer: Self-pay | Admitting: Oncology

## 2015-06-20 NOTE — Telephone Encounter (Signed)
Called and spoke to patient on phone.  She is doing good.  No real problems.  Since moving patient goes to [facility] to be seen by Dr. Harden Mo. Remer Macho 2:45 pm

## 2015-06-29 ENCOUNTER — Telehealth: Payer: Self-pay | Admitting: Oncology

## 2015-06-29 NOTE — Telephone Encounter (Signed)
06/29/15 - Spoke with patient on the phone.  Patient states that she is doing good.  She had a follow-up visit with the surgeon at [facility] back in Feb. 2017 and everything was good.  No cancer.   I will call and check on her again next year.  The patient was thanked for her support in this clinical trial. Remer Macho  06/29/15 - 9:45 am

## 2015-07-28 ENCOUNTER — Encounter: Payer: Self-pay | Admitting: Genetic Counselor

## 2015-09-12 ENCOUNTER — Encounter: Payer: Self-pay | Admitting: Genetic Counselor

## 2015-09-12 ENCOUNTER — Telehealth: Payer: Self-pay | Admitting: Genetic Counselor

## 2015-09-12 NOTE — Telephone Encounter (Signed)
Pt responded to the genetic letter, confirmed appt, completed intake, mailed pt letter.

## 2015-10-23 ENCOUNTER — Encounter: Payer: Self-pay | Admitting: Genetic Counselor

## 2015-10-30 ENCOUNTER — Ambulatory Visit (HOSPITAL_BASED_OUTPATIENT_CLINIC_OR_DEPARTMENT_OTHER): Payer: Medicare Other | Admitting: Genetic Counselor

## 2015-10-30 ENCOUNTER — Encounter: Payer: Self-pay | Admitting: Genetic Counselor

## 2015-10-30 ENCOUNTER — Other Ambulatory Visit: Payer: Medicare Other

## 2015-10-30 DIAGNOSIS — Z803 Family history of malignant neoplasm of breast: Secondary | ICD-10-CM | POA: Diagnosis not present

## 2015-10-30 DIAGNOSIS — Z853 Personal history of malignant neoplasm of breast: Secondary | ICD-10-CM

## 2015-10-30 DIAGNOSIS — Z315 Encounter for genetic counseling: Secondary | ICD-10-CM | POA: Diagnosis not present

## 2015-10-30 DIAGNOSIS — D334 Benign neoplasm of spinal cord: Secondary | ICD-10-CM | POA: Diagnosis not present

## 2015-10-30 NOTE — Progress Notes (Signed)
REFERRING PROVIDER: Meliton Rattan, MD [place], [place] [address]  PRIMARY PROVIDER:  Meliton Rattan, MD  PRIMARY REASON FOR VISIT:  1. BREAST CANCER, HX OF   2. Family history of breast cancer   3. Schwannoma of spinal cord (HCC)      HISTORY OF PRESENT ILLNESS:   [name] [name], a 75 y.o. female, was seen for a [place] cancer genetics consultation at the request of Dr. Katherina Mires due to a personal and family history of cancer.  [name] [name] presents to clinic today to discuss the possibility of a hereditary predisposition to cancer, genetic testing, and to further clarify her future cancer risks, as well as potential cancer risks for family members.   In 2009, at the age of 50, [name] [name] was diagnosed with DCIS of the right breast. This was treated with lumpectomy and radiation. The tumor was WE-/PR-.  She had BRCA testing at the time which was negative.  No BART was performed.  In 2015, at the age of 3, [name] [name] was diagnosed with DCIS of the left breast.  She decided to have a double mastectomy.  She reports having several schwannomas in her spine that have been causing her pain.    CANCER HISTORY:   No history exists.     HORMONAL RISK FACTORS:  Menarche was at age 49-12.  First live birth at age 20.  OCP use for approximately 5+ years.  Ovaries intact: yes.  Hysterectomy: no.  Menopausal status: postmenopausal.  HRT use: for several years. Colonoscopy: yes; several polyps. Mammogram within the last year: no, dbl mastectomy. Number of breast biopsies: 3. Up to date with pelvic exams:  yes. Any excessive radiation exposure in the past:  no  Past Medical History:  Diagnosis Date  . Allergy   . Cancer [facility]) 2009   DCIS-right breast  . Diabetes mellitus   . Elevated cholesterol   . Family history of breast cancer   . GERD (gastroesophageal reflux disease)   . Hiatal hernia   . Menopausal symptoms   . Osteoarthritis    left  .  Osteoporosis   . Schwannoma of spinal cord ([place] [place])   . Sleep apnea    uses cpap    Past Surgical History:  Procedure Laterality Date  . APPENDECTOMY  1974  . BREAST LUMPECTOMY Right 10/2007  . CHOLECYSTECTOMY  1974  . INCONTINENCE SURGERY  2008    Social History   Social History  . Marital status: Divorced    Spouse name: N/A  . Number of children: 3  . Years of education: N/A   Occupational History  . Retired Retired   Social History Main Topics  . Smoking status: Former Smoker    Types: Cigarettes    Quit date: 02/12/1976  . Smokeless tobacco: Never Used  . Alcohol use Yes     Comment: once a year  . Drug use: No  . Sexual activity: No   Other Topics Concern  . None   Social History Narrative  . None     FAMILY HISTORY:  We obtained a detailed, 4-generation family history.  Significant diagnoses are listed below: Family History  Problem Relation Age of Onset  . Diabetes Mother   . Heart disease Mother   . Breast cancer Mother 37  . Cancer Father     esoph, hip, spine  . Diabetes Father   . Heart disease Maternal Grandmother   . Diabetes Maternal Grandfather   .  Breast cancer Cousin     maternal first cousin dx in her 51s-50s  . Colon cancer Neg Hx     The patient has three sons who are healthy and a brother who does not have cancer.  Her father had esophageal cancer and died of mets from this.  He had a paternal half brother which no information is known about. His parents died of non cancer related issues.  Her mother was diagnosed with breast cancer at 70.  She had a brother and sister who did not have cancer, but her sister had a daughter with breast cancer in her 97's-50's.  There is no other reported family history of cancer.  Patient's maternal ancestors are of [place] descent, and paternal ancestors are of [place] descent. There is reported Ashkenazi Jewish ancestry. There is no known consanguinity.  GENETIC COUNSELING ASSESSMENT: [name] [name] is a 75  y.o. female with a personal and family history of cancer which is somewhat suggestive of a hereditary cancer syndrome and predisposition to cancer. We, therefore, discussed and recommended the following at today's visit.   DISCUSSION: We discussed that about 5-10% of breast cancer is due to hereditary causes, with most cases due to BRCA mutations.  The patient was tested for BRCA mutations in 2012, which was negative.  BART was not tested.  We discussed that there are moderate risk genes that we can now test for, including PALB2, ATM and CHEK2, in addition to updated BRCA testing.  We also discussed that there are several conditions that are associated with schwannoma's, including SMARCB1 and NF2.  Since the patient does not have vestibular schwannomas causing deafness, it is highly unlikely that she has NF2, but we will offer testing for Schwannoma's based on her diagnosis.  We reviewed the characteristics, features and inheritance patterns of hereditary cancer syndromes. We also discussed genetic testing, including the appropriate family members to test, the process of testing, insurance coverage and turn-around-time for results. We discussed the implications of a negative, positive and/or variant of uncertain significant result. We recommended [name] [name] pursue genetic testing for the Hereditary cancer gene panel and genes associated with schwannomas.   Based on Ms. Arpino's personal and family history of cancer, she meets medical criteria for genetic testing. Despite that she meets criteria, she may still have an out of pocket cost. We discussed that if her out of pocket cost for testing is over $100, the laboratory will call and confirm whether she wants to proceed with testing.  If the out of pocket cost of testing is less than $100 she will be billed by the genetic testing laboratory.   PLAN: After considering the risks, benefits, and limitations, [name] [name]  provided informed consent to pursue  genetic testing and the blood sample was sent to [facility] for analysis of the Hereditary cancer panel and schwannomas. Results should be available within approximately 2-3 weeks' time, at which point they will be disclosed by telephone to [name] [name], as will any additional recommendations warranted by these results. [name] [name] will receive a summary of her genetic counseling visit and a copy of her results once available. This information will also be available in Epic. We encouraged [name] [name] to remain in contact with cancer genetics annually so that we can continuously update the family history and inform her of any changes in cancer genetics and testing that may be of benefit for her family. [name] [name] questions were answered to her satisfaction today. Our contact information was  provided should additional questions or concerns arise.  Lastly, we encouraged [name] [name] to remain in contact with cancer genetics annually so that we can continuously update the family history and inform her of any changes in cancer genetics and testing that may be of benefit for this family.   Ms.  [name] questions were answered to her satisfaction today. Our contact information was provided should additional questions or concerns arise. Thank you for the referral and allowing [place] to share in the care of your patient.   Karen P. Florene Glen, [place], [facility] Certified Genetic Counselor [email or web address] phone: [phone]  The patient was seen for a total of 60 minutes in face-to-face genetic counseling.  This patient was discussed with Drs. Magrinat, Lindi Adie and/or Burr Medico who agrees with the above.    _______________________________________________________________________ For Office Staff:  Number of people involved in session: 1 Was an Intern/ student involved with case: no

## 2015-11-07 ENCOUNTER — Encounter: Payer: Self-pay | Admitting: Genetic Counselor

## 2015-11-07 ENCOUNTER — Ambulatory Visit: Payer: Self-pay | Admitting: Genetic Counselor

## 2015-11-07 ENCOUNTER — Telehealth: Payer: Self-pay | Admitting: Genetic Counselor

## 2015-11-07 DIAGNOSIS — Z1379 Encounter for other screening for genetic and chromosomal anomalies: Secondary | ICD-10-CM | POA: Insufficient documentation

## 2015-11-07 DIAGNOSIS — D334 Benign neoplasm of spinal cord: Secondary | ICD-10-CM

## 2015-11-07 DIAGNOSIS — Z853 Personal history of malignant neoplasm of breast: Secondary | ICD-10-CM

## 2015-11-07 DIAGNOSIS — Z803 Family history of malignant neoplasm of breast: Secondary | ICD-10-CM

## 2015-11-07 NOTE — Progress Notes (Signed)
HPI: [name] was previously seen in the [facility] due to a personal and family history of cancer and concerns regarding a hereditary predisposition to cancer. Please refer to our prior cancer genetics clinic note for more information regarding [name]'s medical, social and family histories, and our assessment and recommendations, at the time. [name] recent genetic test results were disclosed to her, as were recommendations warranted by these results. These results and recommendations are discussed in more detail below.  FAMILY HISTORY:  We obtained a detailed, 4-generation family history.  Significant diagnoses are listed below: Family History  Problem Relation Age of Onset  . Diabetes Mother   . Heart disease Mother   . Breast cancer Mother 67  . Cancer Father     esoph, hip, spine  . Diabetes Father   . Heart disease Maternal Grandmother   . Diabetes Maternal Grandfather   . Breast cancer Cousin     maternal first cousin dx in her 14s-50s  . Colon cancer Neg Hx     The patient has three sons who are healthy and a brother who does not have cancer.  Her father had esophageal cancer and died of mets from this.  He had a paternal half brother which no information is known about. His parents died of non cancer related issues.  Her mother was diagnosed with breast cancer at 14.  She had a brother and sister who did not have cancer, but her sister had a daughter with breast cancer in her 52's-50's.  There is no other reported family history of cancer.  Patient's maternal ancestors are of [place] descent, and paternal ancestors are of [place] descent. There is reported Ashkenazi Jewish ancestry. There is no known consanguinity.  GENETIC TEST RESULTS: At the time of [name]'s visit, we recommended she pursue genetic testing of the Common Hereditary gene panel and NF2 and SMARCB1 to test for Schwannoma's. The Custom Gene Panel offered by [facility] includes  sequencing and/or deletion duplication testing of the following 44 genes: APC, ATM, AXIN2, BARD1, BMPR1A, BRCA1, BRCA2, BRIP1, CDH1, CDKN2A, CHEK2, DICER1, EPCAM, GREM1, KIT, MEN1, MLH1, MSH2, MSH6, MUTYH, NBN, NF1, NF2, PALB2, PDGFRA, PMS2, POLD1, POLE, PTEN, RAD50, RAD51C, RAD51D, SDHA, SDHB, SDHC, SDHD, SMAD4, SMARCB1, SMARCA4. STK11, TP53, TSC1, TSC2, and VHL.  The report date is November 06, 2015.  Genetic testing was normal, and did not reveal a deleterious mutation in these genes. The test report has been scanned into EPIC and is located under the Molecular Pathology section of the Results Review tab.   We discussed with [name] that since the current genetic testing is not perfect, it is possible there may be a gene mutation in one of these genes that current testing cannot detect, but that chance is small. We also discussed, that it is possible that another gene that has not yet been discovered, or that we have not yet tested, is responsible for the cancer diagnoses in the family, and it is, therefore, important to remain in touch with cancer genetics in the future so that we can continue to offer [name] the most up to date genetic testing.   CANCER SCREENING RECOMMENDATIONS: This result is reassuring and indicates that [name] likely does not have an increased risk for a future cancer due to a mutation in one of these genes. This normal test also suggests that [name]'s cancer was most likely not due to an inherited predisposition associated with one of these  genes.  Most cancers happen by chance and this negative test suggests that her cancer falls into this category.  We, therefore, recommended she continue to follow the cancer management and screening guidelines provided by her oncology and primary healthcare provider.   RECOMMENDATIONS FOR FAMILY MEMBERS: Women in this family might be at some increased risk of developing cancer, over the general population risk, simply due to  the family history of cancer. We recommended women in this family have a yearly mammogram beginning at age 22, or 41 years younger than the earliest onset of cancer, an an annual clinical breast exam, and perform monthly breast self-exams. Women in this family should also have a gynecological exam as recommended by their primary provider. All family members should have a colonoscopy by age 28.  FOLLOW-UP: Lastly, we discussed with [name] that cancer genetics is a rapidly advancing field and it is possible that new genetic tests will be appropriate for her and/or her family members in the future. We encouraged her to remain in contact with cancer genetics on an annual basis so we can update her personal and family histories and let her know of advances in cancer genetics that may benefit this family.   Our contact number was provided. [name] questions were answered to her satisfaction, and she knows she is welcome to call us at anytime with additional questions or concerns.   Roma Kayser, MS, [facility] Certified Genetic Counselor [email or web address]

## 2015-11-07 NOTE — Telephone Encounter (Signed)
Revealed negative genetic testing on the hereditary cancer panel and the NF2 and SMARCB1 gene.

## 2016-04-24 ENCOUNTER — Encounter (HOSPITAL_COMMUNITY): Payer: Self-pay

## 2017-07-05 ENCOUNTER — Encounter (HOSPITAL_COMMUNITY): Payer: Self-pay | Admitting: Oncology

## 2018-04-10 DIAGNOSIS — Z853 Personal history of malignant neoplasm of breast: Secondary | ICD-10-CM

## 2018-04-10 NOTE — Research (Signed)
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  I contacted patient at [phone] and left message for her to contact me.  I also attempted to contact her at [phone] (cell phone) and mailbox was full.  I will wait for return phone call.  Lula Olszewski Oncology Research Assistant 04/10/2018 3:51 PM   Patient returned my phone call.   Patient states she is doing well and has had no new cancers diagnosed since last contact.  She shares she had a double mastectomy in 2016 so obviously no breast cancer recurrence.  She has been to dermatologist since last contact and states she had a basal cell bump removed.  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  Patient is agreeable to be contacted for future follow up requirements.  Lula Olszewski Oncology Research Assistant 04/13/2018 3:52 PM
